# Patient Record
Sex: Female | Born: 1995 | Hispanic: No | Marital: Single | Smoking: Never smoker
Health system: Southern US, Community
[De-identification: ages and names within clinical notes are randomized; demographics above are authoritative.]

## PROBLEM LIST (undated history)

## (undated) DIAGNOSIS — C801 Malignant (primary) neoplasm, unspecified: Secondary | ICD-10-CM

## (undated) DIAGNOSIS — H3500 Unspecified background retinopathy: Secondary | ICD-10-CM

## (undated) DIAGNOSIS — F70 Mild intellectual disabilities: Secondary | ICD-10-CM

## (undated) DIAGNOSIS — E079 Disorder of thyroid, unspecified: Secondary | ICD-10-CM

## (undated) DIAGNOSIS — R569 Unspecified convulsions: Secondary | ICD-10-CM

## (undated) DIAGNOSIS — J45909 Unspecified asthma, uncomplicated: Secondary | ICD-10-CM

## (undated) DIAGNOSIS — Q7033 Webbed toes, bilateral: Secondary | ICD-10-CM

## (undated) HISTORY — PX: RHINOPLASTY FOR CLEFT LIP / PALATE: SUR1285

## (undated) HISTORY — PX: TYMPANOSTOMY TUBE PLACEMENT: SHX32

## (undated) HISTORY — DX: Disorder of thyroid, unspecified: E07.9

## (undated) HISTORY — PX: STRABISMUS SURGERY: SHX218

## (undated) HISTORY — DX: Webbed toes, bilateral: Q70.33

## (undated) HISTORY — PX: EYE SURGERY: SHX253

## (undated) HISTORY — PX: BIOPSY THYROID: PRO38

## (undated) HISTORY — PX: HERNIA REPAIR: SHX51

---
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
## 2017-03-30 ENCOUNTER — Encounter (HOSPITAL_COMMUNITY): Payer: Self-pay

## 2017-04-03 ENCOUNTER — Encounter (HOSPITAL_COMMUNITY)
Admission: RE | Admit: 2017-04-03 | Discharge: 2017-04-03 | Disposition: A | Discharge status: Home / Self Care | Payer: Medicaid Other | Source: Ambulatory Visit | Attending: Ophthalmology | Admitting: Ophthalmology

## 2017-04-03 HISTORY — DX: Unspecified background retinopathy: H35.00

## 2017-04-23 ENCOUNTER — Encounter (HOSPITAL_COMMUNITY): Payer: Self-pay

## 2017-04-23 ENCOUNTER — Encounter (HOSPITAL_COMMUNITY)
Admission: RE | Admit: 2017-04-23 | Discharge: 2017-04-23 | Disposition: A | Discharge status: Home / Self Care | Payer: Medicaid Other | Source: Ambulatory Visit | Attending: Ophthalmology | Admitting: Ophthalmology

## 2017-04-23 DIAGNOSIS — Z01812 Encounter for preprocedural laboratory examination: Secondary | ICD-10-CM | POA: Diagnosis present

## 2017-04-23 DIAGNOSIS — H25043 Posterior subcapsular polar age-related cataract, bilateral: Secondary | ICD-10-CM | POA: Diagnosis not present

## 2017-04-23 HISTORY — DX: Unspecified asthma, uncomplicated: J45.909

## 2017-04-23 HISTORY — DX: Unspecified convulsions: R56.9

## 2017-04-23 HISTORY — DX: Mild intellectual disabilities: F70

## 2017-04-23 LAB — BASIC METABOLIC PANEL
Anion gap: 8 (ref 5–15)
BUN: 18 mg/dL (ref 6–20)
CO2: 26 mmol/L (ref 22–32)
CREATININE: 0.79 mg/dL (ref 0.44–1.00)
Calcium: 9.3 mg/dL (ref 8.9–10.3)
Chloride: 107 mmol/L (ref 101–111)
GFR calc Af Amer: >60 mL/min (ref >60)
Glucose, Bld: 94 mg/dL (ref 65–99)
Potassium: 4.1 mmol/L (ref 3.5–5.1)
SODIUM: 141 mmol/L (ref 135–145)

## 2017-04-23 LAB — CBC WITH DIFFERENTIAL/PLATELET
BASOS ABS: 0.1 10*3/uL (ref 0.0–0.1)
Basophils Relative: 1 %
EOS ABS: 0.5 10*3/uL (ref 0.0–0.7)
EOS PCT: 6 %
HCT: 43.3 % (ref 36.0–46.0)
Hemoglobin: 14.7 g/dL (ref 12.0–15.0)
LYMPHS ABS: 2.6 10*3/uL (ref 0.7–4.0)
Lymphocytes Relative: 30 %
MCH: 29.6 pg (ref 26.0–34.0)
MCHC: 33.9 g/dL (ref 30.0–36.0)
MCV: 87.3 fL (ref 78.0–100.0)
Monocytes Absolute: 0.6 10*3/uL (ref 0.1–1.0)
Monocytes Relative: 7 %
Neutro Abs: 4.9 10*3/uL (ref 1.7–7.7)
Neutrophils Relative %: 56 %
PLATELETS: 293 10*3/uL (ref 150–400)
RBC: 4.96 MIL/uL (ref 3.87–5.11)
RDW: 11.9 % (ref 11.5–15.5)
WBC: 8.7 10*3/uL (ref 4.0–10.5)

## 2017-04-23 LAB — HCG, SERUM, QUALITATIVE: PREG SERUM: NEGATIVE

## 2017-04-23 NOTE — Patient Instructions (Signed)
Your procedure is scheduled on: 04/30/2014   Report to [facility] at  75   AM.  Call this number if you have problems the morning of surgery: [phone]   Do not eat food or drink liquids :After Midnight.      Take these medicines the morning of surgery with A SIP OF WATER: None   Do not wear jewelry, make-up or nail polish.  Do not wear lotions, powders, or perfumes. You may wear deodorant.  Do not shave 48 hours prior to surgery.  Do not bring valuables to the hospital.  Contacts, dentures or bridgework may not be worn into surgery.  Leave suitcase in the car. After surgery it may be brought to your room.  For patients admitted to the hospital, checkout time is 11:00 AM the day of discharge.   Patients discharged the day of surgery will not be allowed to drive home.  :     Please read over the following fact sheets that you were given: Coughing and Deep Breathing, Surgical Site Infection Prevention, Anesthesia Post-op Instructions and Care and Recovery After Surgery    Cataract A cataract is a clouding of the lens of the eye. When a lens becomes cloudy, vision is reduced based on the degree and nature of the clouding. Many cataracts reduce vision to some degree. Some cataracts make people more near-sighted as they develop. Other cataracts increase glare. Cataracts that are ignored and become worse can sometimes look white. The white color can be seen through the pupil. CAUSES   Aging. However, cataracts may occur at any age, even in newborns.   Certain drugs.   Trauma to the eye.   Certain diseases such as diabetes.   Specific eye diseases such as chronic inflammation inside the eye or a sudden attack of a rare form of glaucoma.   Inherited or acquired medical problems.  SYMPTOMS   Gradual, progressive drop in vision in the affected eye.   Severe, rapid visual loss. This most often happens when trauma is the cause.  DIAGNOSIS  To detect a cataract, an eye doctor examines  the lens. Cataracts are best diagnosed with an exam of the eyes with the pupils enlarged (dilated) by drops.  TREATMENT  For an early cataract, vision may improve by using different eyeglasses or stronger lighting. If that does not help your vision, surgery is the only effective treatment. A cataract needs to be surgically removed when vision loss interferes with your everyday activities, such as driving, reading, or watching TV. A cataract may also have to be removed if it prevents examination or treatment of another eye problem. Surgery removes the cloudy lens and usually replaces it with a substitute lens (intraocular lens, IOL).  At a time when both you and your doctor agree, the cataract will be surgically removed. If you have cataracts in both eyes, only one is usually removed at a time. This allows the operated eye to heal and be out of danger from any possible problems after surgery (such as infection or poor wound healing). In rare cases, a cataract may be doing damage to your eye. In these cases, your caregiver may advise surgical removal right away. The vast majority of people who have cataract surgery have better vision afterward. HOME CARE INSTRUCTIONS  If you are not planning surgery, you may be asked to do the following:  Use different eyeglasses.   Use stronger or brighter lighting.   Ask your eye doctor about reducing your medicine  dose or changing medicines if it is thought that a medicine caused your cataract. Changing medicines does not make the cataract go away on its own.   Become familiar with your surroundings. Poor vision can lead to injury. Avoid bumping into things on the affected side. You are at a higher risk for tripping or falling.   Exercise extreme care when driving or operating machinery.   Wear sunglasses if you are sensitive to bright light or experiencing problems with glare.  SEEK IMMEDIATE MEDICAL CARE IF:   You have a worsening or sudden vision loss.    You notice redness, swelling, or increasing pain in the eye.   You have a fever.  Document Released: 10/23/2005 Document Revised: 10/12/2011 Document Reviewed: 06/16/2011 [facility] Patient Information 2012 [place].PATIENT INSTRUCTIONS POST-ANESTHESIA  IMMEDIATELY FOLLOWING SURGERY:  Do not drive or operate machinery for the first twenty four hours after surgery.  Do not make any important decisions for twenty four hours after surgery or while taking narcotic pain medications or sedatives.  If you develop intractable nausea and vomiting or a severe headache please notify your doctor immediately.  FOLLOW-UP:  Please make an appointment with your surgeon as instructed. You do not need to follow up with anesthesia unless specifically instructed to do so.  WOUND CARE INSTRUCTIONS (if applicable):  Keep a dry clean dressing on the anesthesia/puncture wound site if there is drainage.  Once the wound has quit draining you may leave it open to air.  Generally you should leave the bandage intact for twenty four hours unless there is drainage.  If the epidural site drains for more than 36-48 hours please call the anesthesia department.  QUESTIONS?:  Please feel free to call your physician or the hospital operator if you have any questions, and they will be happy to assist you.

## 2017-04-30 ENCOUNTER — Encounter (HOSPITAL_COMMUNITY)
Admission: RE | Disposition: A | Discharge status: Home / Self Care | Payer: Self-pay | Source: Ambulatory Visit | Attending: Ophthalmology

## 2017-04-30 ENCOUNTER — Ambulatory Visit (HOSPITAL_COMMUNITY)
Admission: RE | Admit: 2017-04-30 | Discharge: 2017-04-30 | Disposition: A | Discharge status: Home / Self Care | Payer: Medicaid Other | Source: Ambulatory Visit | Attending: Ophthalmology | Admitting: Ophthalmology

## 2017-04-30 ENCOUNTER — Ambulatory Visit (HOSPITAL_COMMUNITY): Payer: Medicaid Other | Admitting: Anesthesiology

## 2017-04-30 ENCOUNTER — Encounter (HOSPITAL_COMMUNITY): Payer: Self-pay

## 2017-04-30 DIAGNOSIS — F419 Anxiety disorder, unspecified: Secondary | ICD-10-CM | POA: Insufficient documentation

## 2017-04-30 DIAGNOSIS — H25043 Posterior subcapsular polar age-related cataract, bilateral: Secondary | ICD-10-CM | POA: Insufficient documentation

## 2017-04-30 DIAGNOSIS — J45909 Unspecified asthma, uncomplicated: Secondary | ICD-10-CM | POA: Insufficient documentation

## 2017-04-30 HISTORY — PX: CATARACT EXTRACTION W/PHACO: SHX586

## 2017-04-30 SURGERY — PHACOEMULSIFICATION, CATARACT, WITH IOL INSERTION
Anesthesia: General | Site: Eye | Laterality: Bilateral

## 2017-04-30 MED ORDER — CYCLOPENTOLATE-PHENYLEPHRINE 0.2-1 % OP SOLN
1.0000 [drp] | OPHTHALMIC | Status: AC
  Administered 2017-04-30 (×3): 1 [drp] via OPHTHALMIC

## 2017-04-30 MED ORDER — MIDAZOLAM HCL 5 MG/5ML IJ SOLN
INTRAMUSCULAR | Status: DC | PRN
  Administered 2017-04-30 (×2): 1 mg via INTRAVENOUS

## 2017-04-30 MED ORDER — MIDAZOLAM HCL 2 MG/2ML IJ SOLN
INTRAMUSCULAR | Status: AC
  Filled 2017-04-30: qty 2

## 2017-04-30 MED ORDER — PHENYLEPHRINE HCL 2.5 % OP SOLN
1.0000 [drp] | OPHTHALMIC | Status: AC
  Administered 2017-04-30 (×3): 1 [drp] via OPHTHALMIC

## 2017-04-30 MED ORDER — PROPOFOL 10 MG/ML IV BOLUS
INTRAVENOUS | Status: DC | PRN
  Administered 2017-04-30: 120 mg via INTRAVENOUS

## 2017-04-30 MED ORDER — BSS IO SOLN
INTRAOCULAR | Status: DC | PRN
  Administered 2017-04-30 (×2): 15 mL

## 2017-04-30 MED ORDER — TETRACAINE HCL 0.5 % OP SOLN
1.0000 [drp] | OPHTHALMIC | Status: AC
  Administered 2017-04-30 (×3): 1 [drp] via OPHTHALMIC

## 2017-04-30 MED ORDER — HYDROCODONE-ACETAMINOPHEN 7.5-325 MG PO TABS: 1.0000 | ORAL_TABLET | Freq: Once | ORAL | Status: DC | PRN

## 2017-04-30 MED ORDER — PROPOFOL 10 MG/ML IV BOLUS
INTRAVENOUS | Status: AC
  Filled 2017-04-30: qty 20

## 2017-04-30 MED ORDER — PROVISC 10 MG/ML IO SOLN
INTRAOCULAR | Status: DC | PRN
  Administered 2017-04-30 (×2): 0.85 mL via INTRAOCULAR

## 2017-04-30 MED ORDER — LIDOCAINE HCL 3.5 % OP GEL
1.0000 "application " | Freq: Once | OPHTHALMIC | Status: AC
  Administered 2017-04-30: 1 via OPHTHALMIC

## 2017-04-30 MED ORDER — ATROPINE SULFATE 0.4 MG/ML IJ SOLN
INTRAMUSCULAR | Status: AC
  Filled 2017-04-30: qty 1

## 2017-04-30 MED ORDER — NEOMYCIN-POLYMYXIN-DEXAMETH 3.5-10000-0.1 OP SUSP
OPHTHALMIC | Status: DC | PRN
  Administered 2017-04-30 (×2): 2 [drp] via OPHTHALMIC

## 2017-04-30 MED ORDER — FENTANYL CITRATE (PF) 100 MCG/2ML IJ SOLN: 25.0000 ug | INTRAMUSCULAR | Status: DC | PRN

## 2017-04-30 MED ORDER — EPINEPHRINE PF 1 MG/ML IJ SOLN
INTRAOCULAR | Status: DC | PRN
  Administered 2017-04-30 (×2): 500 mL

## 2017-04-30 MED ORDER — MIDAZOLAM HCL 2 MG/2ML IJ SOLN
1.0000 mg | INTRAMUSCULAR | Status: DC | PRN
  Administered 2017-04-30: 1 mg via INTRAVENOUS

## 2017-04-30 MED ORDER — LIDOCAINE HCL (PF) 1 % IJ SOLN
INTRAMUSCULAR | Status: DC | PRN
  Administered 2017-04-30: .4 mL
  Administered 2017-04-30: .5 mL

## 2017-04-30 MED ORDER — FENTANYL CITRATE (PF) 100 MCG/2ML IJ SOLN
INTRAMUSCULAR | Status: AC
  Filled 2017-04-30: qty 2

## 2017-04-30 MED ORDER — MIDAZOLAM HCL 2 MG/2ML IJ SOLN
INTRAMUSCULAR | Status: AC
Start: 2017-04-30 — End: ?
  Filled 2017-04-30: qty 2

## 2017-04-30 MED ORDER — LACTATED RINGERS IV SOLN
INTRAVENOUS | Status: DC
  Administered 2017-04-30: 07:00:00 via INTRAVENOUS

## 2017-04-30 MED ORDER — POVIDONE-IODINE 5 % OP SOLN
OPHTHALMIC | Status: DC | PRN
  Administered 2017-04-30 (×2): 1 via OPHTHALMIC

## 2017-04-30 SURGICAL SUPPLY — 11 items
CLOTH BEACON ORANGE TIMEOUT ST (SAFETY) ×6 IMPLANT
EYE SHIELD UNIVERSAL CLEAR (GAUZE/BANDAGES/DRESSINGS) ×6 IMPLANT
GLOVE BIOGEL PI IND STRL 7.0 (GLOVE) ×4 IMPLANT
GLOVE BIOGEL PI INDICATOR 7.0 (GLOVE) ×8
LENS ALC ACRYL/TECN (Ophthalmic Related) ×6 IMPLANT
PAD ARMBOARD 7.5X6 YLW CONV (MISCELLANEOUS) ×3 IMPLANT
SYRINGE LUER LOK 1CC (MISCELLANEOUS) ×6 IMPLANT
TAPE SURG TRANSPORE 1 IN (GAUZE/BANDAGES/DRESSINGS) ×2 IMPLANT
TAPE SURGICAL TRANSPORE 1 IN (GAUZE/BANDAGES/DRESSINGS) ×4
VISCOELASTIC ADDITIONAL (OPHTHALMIC RELATED) ×3 IMPLANT
WATER STERILE IRR 250ML POUR (IV SOLUTION) ×6 IMPLANT

## 2017-04-30 NOTE — H&P (Signed)
I have reviewed the H&P, the patient was re-examined, and I have identified no interval changes in medical condition and plan of care since the history and physical of record  

## 2017-04-30 NOTE — Transfer of Care (Signed)
Immediate Anesthesia Transfer of Care Note  Patient: DENNI FRANCE  Procedure(s) Performed: Procedure(s) with comments: CATARACT EXTRACTION PHACO AND INTRAOCULAR LENS PLACEMENT (IOC) (Bilateral) - LEFT EYE CDE: 0.46 RIGHT EYE CDE: 0.00  Patient Location: PACU  Anesthesia Type:General  Level of Consciousness: drowsy and patient cooperative  Airway & Oxygen Therapy: Patient Spontanous Breathing  Post-op Assessment: Report given to RN, Post -op Vital signs reviewed and stable and Patient moving all extremities  Post vital signs: Reviewed and stable  Last Vitals:  Vitals:   04/30/17 0643  BP: 127/60  Pulse: 93  Resp: 19  Temp: 36.6 C    Last Pain:  Vitals:   04/30/17 0643  TempSrc: Oral         Complications: No apparent anesthesia complications

## 2017-04-30 NOTE — Anesthesia Postprocedure Evaluation (Signed)
Anesthesia Post Note  Patient: Sherri Wiggins  Procedure(s) Performed: Procedure(s) (LRB): CATARACT EXTRACTION PHACO AND INTRAOCULAR LENS PLACEMENT (IOC) (Bilateral)  Patient location during evaluation: PACU Anesthesia Type: General Level of consciousness: awake and patient cooperative Pain management: pain level controlled Vital Signs Assessment: post-procedure vital signs reviewed and stable Respiratory status: nonlabored ventilation, spontaneous breathing and respiratory function stable Cardiovascular status: blood pressure returned to baseline Postop Assessment: no signs of nausea or vomiting Anesthetic complications: no     Last Vitals:  Vitals:   04/30/17 0643 04/30/17 0835  BP: 127/60 (!) 113/55  Pulse: 93 71  Resp: 19 20  Temp: 36.6 C 36.5 C    Last Pain:  Vitals:   04/30/17 0643  TempSrc: Oral                 Sherri Wiggins

## 2017-04-30 NOTE — Anesthesia Preprocedure Evaluation (Signed)
Anesthesia Evaluation  Patient identified by MRN, date of birth, ID band Patient awake    Reviewed: Allergy & Precautions, NPO status , Patient's Chart, lab work & pertinent test results, reviewed documented beta blocker date and time   History of Anesthesia Complications Negative for: history of anesthetic complications  Airway Mallampati: III  TM Distance: >3 FB Neck ROM: Full    Dental no notable dental hx.    Pulmonary asthma ,    Pulmonary exam normal        Cardiovascular negative cardio ROS Normal cardiovascular exam Rhythm:Regular Rate:Normal     Neuro/Psych Seizures -, Well Controlled,  Anxiety Mental handicap, only one reported, no seizure meds    GI/Hepatic negative GI ROS, Neg liver ROS,   Endo/Other  negative endocrine ROS  Renal/GU negative Renal ROS  negative genitourinary   Musculoskeletal negative musculoskeletal ROS (+)   Abdominal Normal abdominal exam  (+)   Peds Cleft lip/plate repair   Hematology negative hematology ROS (+)   Anesthesia Other Findings   Reproductive/Obstetrics negative OB ROS Pregnancy test negative 23 April 2017                             Anesthesia Physical Anesthesia Plan  ASA: III  Anesthesia Plan: General   Post-op Pain Management:    Induction: Intravenous  PONV Risk Score and Plan:   Airway Management Planned: LMA  Additional Equipment:   Intra-op Plan:   Post-operative Plan: Extubation in OR  Informed Consent: I have reviewed the patients History and Physical, chart, labs and discussed the procedure including the risks, benefits and alternatives for the proposed anesthesia with the patient or authorized representative who has indicated his/her understanding and acceptance.     Plan Discussed with: CRNA and Anesthesiologist  Anesthesia Plan Comments:         Anesthesia Quick Evaluation

## 2017-04-30 NOTE — Anesthesia Procedure Notes (Signed)
Procedure Name: LMA Insertion Date/Time: 04/30/2017 7:32 AM Performed by: Charmaine Downs Pre-anesthesia Checklist: Patient identified, Patient being monitored, Emergency Drugs available, Timeout performed and Suction available Patient Re-evaluated:Patient Re-evaluated prior to inductionOxygen Delivery Method: Circle System Utilized Preoxygenation: Pre-oxygenation with 100% oxygen Intubation Type: IV induction Ventilation: Mask ventilation without difficulty LMA: LMA inserted LMA Size: 3.0 Number of attempts: 1 Placement Confirmation: positive ETCO2 and breath sounds checked- equal and bilateral Tube secured with: Tape Dental Injury: Teeth and Oropharynx as per pre-operative assessment

## 2017-04-30 NOTE — Discharge Instructions (Signed)
Moderate Conscious Sedation, Adult, Care After  These instructions provide you with information about caring for yourself after your procedure. Your health care provider may also give you more specific instructions. Your treatment has been planned according to current medical practices, but problems sometimes occur. Call your health care provider if you have any problems or questions after your procedure.  What can I expect after the procedure?  After your procedure, it is common:   To feel sleepy for several hours.   To feel clumsy and have poor balance for several hours.   To have poor judgment for several hours.   To vomit if you eat too soon.    Follow these instructions at home:  For at least 24 hours after the procedure:     Do not:  ? Participate in activities where you could fall or become injured.  ? Drive.  ? Use heavy machinery.  ? Drink alcohol.  ? Take sleeping pills or medicines that cause drowsiness.  ? Make important decisions or sign legal documents.  ? Take care of children on your own.   Rest.  Eating and drinking   Follow the diet recommended by your health care provider.   If you vomit:  ? Drink water, juice, or soup when you can drink without vomiting.  ? Make sure you have little or no nausea before eating solid foods.  General instructions   Have a responsible adult stay with you until you are awake and alert.   Take over-the-counter and prescription medicines only as told by your health care provider.   If you smoke, do not smoke without supervision.   Keep all follow-up visits as told by your health care provider. This is important.  Contact a health care provider if:   You keep feeling nauseous or you keep vomiting.   You feel light-headed.   You develop a rash.   You have a fever.  Get help right away if:   You have trouble breathing.  This information is not intended to replace advice given to you by your health care provider. Make sure you discuss any questions you have  with your health care provider.  Document Released: 08/13/2013 Document Revised: 03/27/2016 Document Reviewed: 02/12/2016  Elsevier Interactive Patient Education  2018 [employer]

## 2017-04-30 NOTE — Op Note (Signed)
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  Assistants: None  Anesthesia: General with LMA  Indications: Painless, progressive loss of vision with compromise of daily activities. Expectation that cataracts will progress.   Surgery: Cataract Extraction with Intraocular lens Implant Bilateral Eye  Discription: The patient had dilating drops and viscous lidocaine placed into the both eyes in the pre-op holding area. After transfer to the operating room, a time out was performed.  The patient was placed under general anesthesia with an LMA placed without difficulty.   The patient was then prepped and draped on the left side. The right eye was taped shut. Beginning with a 71mm blade a paracentesis port was made at the surgeon's 2 o'clock position. The anterior chamber was then filled with 1% non-preserved lidocaine. This was followed by filling the anterior chamber with Provisc.  A 2.21mm keratome blade was used to make a clear corneal incision at the temporal limbus.  A bent cystatome needle was used to create a continuous tear capsulotomy. Hydrodissection was performed with balanced salt solution on a Fine canula. The lens nucleus was then removed using the phacoemulsification handpiece. Residual cortex was removed with the I&A handpiece. The anterior chamber and capsular bag were refilled with Provisc. A posterior chamber intraocular lens was placed into the capsular bag with it's injector. The implant was positioned with the Kuglan hook. The Provisc was then removed from the anterior chamber and capsular bag with the I&A handpiece. Stromal hydration of the main incision and paracentesis port was performed with BSS on a Fine canula. The wounds were tested for leak which was negative. The patient tolerated the procedure well. There were no  operative complications.  The patient was then prepped and draped on the right side. The left eye was taped shut. Beginning with a 61mm blade a paracentesis port was made at the surgeon's 2 o'clock position. The anterior chamber was then filled with 1% non-preserved lidocaine. This was followed by filling the anterior chamber with Provisc.  A 2.34mm keratome blade was used to make a clear corneal incision at the temporal limbus.  A bent cystatome needle was used to create a continuous tear capsulotomy. Hydrodissection was performed with balanced salt solution on a Fine canula. The lens nucleus was then removed using the phacoemulsification handpiece. Residual cortex was removed with the I&A handpiece. The anterior chamber and capsular bag were refilled with Provisc. A posterior chamber intraocular lens was placed into the capsular bag with it's injector. The implant was positioned with the Kuglan hook. The Provisc was then removed from the anterior chamber and capsular bag with the I&A handpiece. Stromal hydration of the main incision and paracentesis port was performed with BSS on a Fine canula. The wounds were tested for leak which was negative. The patient tolerated the procedure well. There were no operative complications.   The patient was then transferred to the recovery room in stable condition.  Complications: None  Specimen: None  EBL: None  Prosthetic device: OD:  [employer], PCB00, power 11.0, SN [id number] OS:  [employer], PCB00, power 11.5, SN [id number].

## 2017-05-01 ENCOUNTER — Encounter (HOSPITAL_COMMUNITY): Payer: Self-pay | Admitting: Ophthalmology

## 2017-05-01 NOTE — Addendum Note (Signed)
Addendum  created 05/01/17 0816 by Schultz, John R, MD   Anesthesia Attestations filed    

## 2017-11-16 ENCOUNTER — Other Ambulatory Visit (HOSPITAL_COMMUNITY): Payer: Self-pay | Admitting: Internal Medicine

## 2017-11-16 DIAGNOSIS — R221 Localized swelling, mass and lump, neck: Secondary | ICD-10-CM

## 2017-11-20 ENCOUNTER — Ambulatory Visit (HOSPITAL_COMMUNITY)
Admission: RE | Admit: 2017-11-20 | Discharge: 2017-11-20 | Disposition: A | Discharge status: Home / Self Care | Payer: Medicaid Other | Source: Ambulatory Visit | Attending: Internal Medicine | Admitting: Internal Medicine

## 2017-11-22 ENCOUNTER — Ambulatory Visit (HOSPITAL_COMMUNITY)
Admission: RE | Admit: 2017-11-22 | Discharge: 2017-11-22 | Disposition: A | Discharge status: Home / Self Care | Payer: Medicaid Other | Source: Ambulatory Visit | Attending: Internal Medicine | Admitting: Internal Medicine

## 2017-11-26 ENCOUNTER — Other Ambulatory Visit: Payer: Self-pay | Admitting: Obstetrics and Gynecology

## 2017-11-26 DIAGNOSIS — O3680X0 Pregnancy with inconclusive fetal viability, not applicable or unspecified: Secondary | ICD-10-CM

## 2017-11-27 ENCOUNTER — Other Ambulatory Visit: Payer: Medicaid Other

## 2017-11-27 ENCOUNTER — Ambulatory Visit (INDEPENDENT_AMBULATORY_CARE_PROVIDER_SITE_OTHER): Payer: Medicaid Other

## 2017-11-27 ENCOUNTER — Other Ambulatory Visit: Payer: Self-pay | Admitting: Obstetrics and Gynecology

## 2017-11-27 DIAGNOSIS — O039 Complete or unspecified spontaneous abortion without complication: Secondary | ICD-10-CM

## 2017-11-27 DIAGNOSIS — O208 Other hemorrhage in early pregnancy: Secondary | ICD-10-CM | POA: Diagnosis not present

## 2017-11-27 DIAGNOSIS — O3680X0 Pregnancy with inconclusive fetal viability, not applicable or unspecified: Secondary | ICD-10-CM

## 2017-11-27 DIAGNOSIS — O209 Hemorrhage in early pregnancy, unspecified: Secondary | ICD-10-CM

## 2017-11-27 DIAGNOSIS — Z3A08 8 weeks gestation of pregnancy: Secondary | ICD-10-CM

## 2017-11-27 NOTE — Progress Notes (Signed)
[place] TA/TV: no IUP visualized,EEC 11.6 mm (small amount of color flow),normal ovaries bilat,no free fluid,no pain during ultrasound,will have labs done to day per Jennifer,blood type B+ per patient

## 2017-11-29 ENCOUNTER — Ambulatory Visit (HOSPITAL_COMMUNITY)
Admission: RE | Admit: 2017-11-29 | Discharge: 2017-11-29 | Disposition: A | Discharge status: Home / Self Care | Payer: Medicaid Other | Source: Ambulatory Visit | Attending: Internal Medicine | Admitting: Internal Medicine

## 2017-11-29 DIAGNOSIS — E041 Nontoxic single thyroid nodule: Secondary | ICD-10-CM | POA: Diagnosis not present

## 2017-11-29 DIAGNOSIS — R221 Localized swelling, mass and lump, neck: Secondary | ICD-10-CM | POA: Diagnosis present

## 2017-12-05 ENCOUNTER — Encounter: Payer: Self-pay | Admitting: Adult Health

## 2017-12-05 ENCOUNTER — Ambulatory Visit: Payer: Medicaid Other | Admitting: Adult Health

## 2017-12-05 VITALS — BP 100/60 | HR 99 | Ht 62.0 in | Wt 115.0 lb

## 2017-12-05 DIAGNOSIS — E041 Nontoxic single thyroid nodule: Secondary | ICD-10-CM

## 2017-12-05 DIAGNOSIS — Z3201 Encounter for pregnancy test, result positive: Secondary | ICD-10-CM | POA: Diagnosis not present

## 2017-12-05 DIAGNOSIS — O039 Complete or unspecified spontaneous abortion without complication: Secondary | ICD-10-CM | POA: Diagnosis not present

## 2017-12-05 LAB — POCT URINE PREGNANCY: Preg Test, Ur: POSITIVE — AB

## 2017-12-05 NOTE — Progress Notes (Addendum)
Subjective:     Patient ID: Sherri Wiggins, female   DOB: [birth year], 22 y.o.   MRN: [id number]  HPI Sherri Wiggins is a 22 year old biracial female in for F/U on miscarriage.Was seen at [facility] 11/24/17 for bleeding and had QHCG of about 12,000 and blood type B+.Had [place] here 11/27/17, no IUP and Sherri Wiggins said could not get blood drawn then. PCP is [facility].   Review of Systems No bleeding now,stopped about 3 days ago.  Reviewed past medical,surgical, social and family history. Reviewed medications and allergies.     Objective:   Physical Exam BP 100/60 (BP Location: Left Arm, Patient Position: Sitting, Cuff Size: Small)   Pulse 99   Ht 5\' 2"  (1.575 m)   Wt 115 lb (52.2 kg)   BMI 21.03 kg/m UPT +,Skin warm and dry. Neck: mid line trachea, has large right thyroid nodule(has had [place], needs FNA), good ROM, no lymphadenopathy noted. Lungs: clear to ausculation bilaterally. Cardiovascular: regular rate and rhythm.PHQ 9 score 3.    Needs F/U [facility], and will request records.Will see next week and discuss birth control.   Assessment:     1. Miscarriage   2. Pregnancy test positive   3. Thyroid nodule       Plan:     Check QHCG in am, go home and hydrate Request records from [facility] from 11/24/17 F/U in 1 week  F/U with Dr Nevada Crane about thyroid

## 2017-12-07 ENCOUNTER — Other Ambulatory Visit: Payer: Self-pay | Admitting: Internal Medicine

## 2017-12-07 ENCOUNTER — Telehealth: Payer: Self-pay | Admitting: Adult Health

## 2017-12-07 DIAGNOSIS — E041 Nontoxic single thyroid nodule: Secondary | ICD-10-CM

## 2017-12-07 LAB — BETA HCG QUANT (REF LAB): HCG QUANT: 182 m[IU]/mL

## 2017-12-07 NOTE — Telephone Encounter (Signed)
Left message to call me.

## 2017-12-13 ENCOUNTER — Encounter: Payer: Self-pay | Admitting: Adult Health

## 2017-12-13 ENCOUNTER — Ambulatory Visit: Payer: Medicaid Other | Admitting: Adult Health

## 2017-12-13 VITALS — BP 102/60 | HR 76 | Ht 62.0 in | Wt 115.5 lb

## 2017-12-13 DIAGNOSIS — O039 Complete or unspecified spontaneous abortion without complication: Secondary | ICD-10-CM | POA: Diagnosis not present

## 2017-12-13 NOTE — Progress Notes (Signed)
Subjective:     Patient ID: Sherri Wiggins, female   DOB: [birth year], 22 y.o.   MRN: [id number]  HPI Sherri Wiggins is a 22 year old biracial female, in for F/U on miscarriage, no bleeding.Wants to use OCs for birth control.   Review of Systems  No bleeding Reviewed past medical,surgical, social and family history. Reviewed medications and allergies.     Objective:   Physical Exam BP 102/60 (BP Location: Left Arm, Patient Position: Sitting, Cuff Size: Normal)   Pulse 76   Ht 5\' 2"  (1.575 m)   Wt 115 lb 8 oz (52.4 kg)   BMI 21.13 kg/m    Talk only: QHCG was dropped from 12,643.0 to 182 on 12/06/17,will check QHCG in am and no sex. Will start in OCs when [facility] <5.  Assessment:     1. Miscarriage       Plan:    Check QHCG in am F/U in 2 weeks No sex

## 2017-12-18 ENCOUNTER — Telehealth: Payer: Self-pay | Admitting: Adult Health

## 2017-12-18 LAB — BETA HCG QUANT (REF LAB): hCG Quant: 24 m[IU]/mL

## 2017-12-18 NOTE — Telephone Encounter (Signed)
Pt aware that QHCG down to 24 recheck in 1 week when has appt.

## 2017-12-18 NOTE — Telephone Encounter (Signed)
Left message to call me.

## 2017-12-27 ENCOUNTER — Ambulatory Visit: Payer: Medicaid Other | Admitting: Adult Health

## 2018-01-03 ENCOUNTER — Encounter: Payer: Self-pay | Admitting: Adult Health

## 2018-01-03 ENCOUNTER — Ambulatory Visit: Payer: Medicaid Other

## 2018-01-03 ENCOUNTER — Encounter (INDEPENDENT_AMBULATORY_CARE_PROVIDER_SITE_OTHER): Payer: Self-pay

## 2018-01-03 ENCOUNTER — Ambulatory Visit: Payer: Medicaid Other | Admitting: Adult Health

## 2018-01-03 VITALS — BP 102/68 | HR 74 | Ht 62.0 in | Wt 115.2 lb

## 2018-01-03 DIAGNOSIS — Z8759 Personal history of other complications of pregnancy, childbirth and the puerperium: Secondary | ICD-10-CM | POA: Diagnosis not present

## 2018-01-03 DIAGNOSIS — E041 Nontoxic single thyroid nodule: Secondary | ICD-10-CM | POA: Diagnosis not present

## 2018-01-03 DIAGNOSIS — Z30011 Encounter for initial prescription of contraceptive pills: Secondary | ICD-10-CM | POA: Insufficient documentation

## 2018-01-03 DIAGNOSIS — Z3202 Encounter for pregnancy test, result negative: Secondary | ICD-10-CM

## 2018-01-03 LAB — POCT URINE PREGNANCY: PREG TEST UR: NEGATIVE

## 2018-01-03 MED ORDER — NORETHIN ACE-ETH ESTRAD-FE 1-20 MG-MCG(24) PO CAPS
1.0000 | ORAL_CAPSULE | Freq: Every day | ORAL | 12 refills | Status: DC
Start: 2018-01-03 — End: 2018-04-02

## 2018-01-03 NOTE — Progress Notes (Signed)
Subjective:     Patient ID: Sherri Wiggins, female   DOB: May 08, 1996, 22 y.o.   MRN: [id number]  HPI Sherri Wiggins is a 22 year old biracial female in for UPT and start OCs, sp recent miscarriage.   Review of Systems Patient denies any headaches, hearing loss, fatigue, blurred vision, shortness of breath, chest pain, abdominal pain, problems with bowel movements, urination, or intercourse. No joint pain or mood swings. Reviewed past medical,surgical, social and family history. Reviewed medications and allergies.     Objective:   Physical Exam BP 102/68 (BP Location: Right Arm, Patient Position: Sitting, Cuff Size: Normal)   Pulse 74   Ht 5\' 2"  (1.575 m)   Wt 115 lb 3.2 oz (52.3 kg)   BMI 21.07 kg/m UPT negative.Skin warm and dry. Neck: mid line trachea,  Right thyroid nodule , good ROM, no lymphadenopathy noted. Lungs: clear to ausculation bilaterally. Cardiovascular: regular rate and rhythm. Will start [place] today and use condoms.But discussed nexplanon too, with pt and her guardian. She said she had period sometime last week.     Assessment:     1. Encounter for initial prescription of contraceptive pills   2. Pregnancy examination or test, negative result   3. History of miscarriage   4. Thyroid nodule       Plan:    Given 1 pack to start [place] today Meds ordered this encounter  Medications  . Norethin Ace-Eth Estrad-FE (TAYTULLA) 1-20 MG-MCG(24) CAPS    Sig: Take 1 tablet by mouth daily.    Dispense:  28 capsule    Refill:  12    Order Specific Question:   Supervising Provider    Answer:   Florian Buff [[id number]]  Use condoms Review handout on nexplanon Follow up in 3 months for pap and physical and ROS

## 2018-02-06 ENCOUNTER — Other Ambulatory Visit (HOSPITAL_COMMUNITY)
Admission: RE | Admit: 2018-02-06 | Discharge: 2018-02-06 | Disposition: A | Discharge status: Home / Self Care | Payer: Medicaid Other | Source: Ambulatory Visit | Attending: Radiology | Admitting: Radiology

## 2018-02-06 ENCOUNTER — Ambulatory Visit
Admission: RE | Admit: 2018-02-06 | Discharge: 2018-02-06 | Disposition: A | Discharge status: Home / Self Care | Payer: Medicaid Other | Source: Ambulatory Visit | Attending: Internal Medicine | Admitting: Internal Medicine

## 2018-02-06 DIAGNOSIS — E041 Nontoxic single thyroid nodule: Secondary | ICD-10-CM | POA: Diagnosis present

## 2018-03-11 ENCOUNTER — Ambulatory Visit: Payer: Medicaid Other | Admitting: "Endocrinology

## 2018-03-11 ENCOUNTER — Encounter: Payer: Self-pay | Admitting: "Endocrinology

## 2018-03-11 VITALS — BP 117/77 | HR 63 | Ht 64.0 in | Wt 114.0 lb

## 2018-03-11 DIAGNOSIS — E049 Nontoxic goiter, unspecified: Secondary | ICD-10-CM | POA: Insufficient documentation

## 2018-03-11 NOTE — Progress Notes (Signed)
Endocrinology Consult Note                                            03/11/2018, 9:48 AM   Subjective:    Patient ID: Sherri Wiggins, female    DOB: 01-31-1996, PCP Celene Squibb, MD   Past Medical History:  Diagnosis Date  . Asthma    as child  . 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 Retinopathy   . Seizures ([place])    One after oral surgery 10 years ago. On no meds, no more seizures   Past Surgical History:  Procedure Laterality Date  . CATARACT EXTRACTION W/PHACO Bilateral 04/30/2017   Procedure: CATARACT EXTRACTION PHACO AND INTRAOCULAR LENS PLACEMENT (IOC);  Surgeon: Tonny Branch, MD;  Location: [facility];  Service: Ophthalmology;  Laterality: Bilateral;  LEFT EYE CDE: 0.46 RIGHT EYE CDE: 0.00  . EYE SURGERY     laser  . HERNIA REPAIR Right   . RHINOPLASTY FOR CLEFT LIP / PALATE    . STRABISMUS SURGERY    . TYMPANOSTOMY TUBE PLACEMENT     Social History   Socioeconomic History  . Marital status: Single    Spouse name: Not on file  . Number of children: Not on file  . Years of education: Not on file  . Highest education level: Not on file  Occupational History  . Not on file  Social Needs  . Financial resource strain: Not on file  . Food insecurity:    Worry: Not on file    Inability: Not on file  . Transportation needs:    Medical: Not on file    Non-medical: Not on file  Tobacco Use  . Smoking status: Never Smoker  . Smokeless tobacco: Never Used  Substance and Sexual Activity  . Alcohol use: Yes    Comment: hardly ever  . Drug use: No  . Sexual activity: Yes    Birth control/protection: None, Condom  Lifestyle  . Physical activity:    Days per week: Not on file    Minutes per session: Not on file  . Stress: Not on file  Relationships  . Social connections:    Talks on phone: Not on file    Gets together: Not on file    Attends religious service: Not on file    Active member of club or organization: Not on file     Attends meetings of clubs or organizations: Not on file    Relationship status: Not on file  Other Topics Concern  . Not on file  Social History Narrative  . Not on file   Outpatient Encounter Medications as of 03/11/2018  Medication Sig  . Norethin Ace-Eth Estrad-FE (TAYTULLA) 1-20 MG-MCG(24) CAPS Take 1 tablet by mouth daily.   No facility-administered encounter medications on file as of 03/11/2018.    ALLERGIES: No Known Allergies  VACCINATION STATUS:  There is no immunization history on file for this patient.  HPI Sherri Wiggins is 22 y.o. female who presents today with a medical history as above. she is being seen in consultation for nodular goiter status post fine-needle aspiration requested by Celene Squibb, MD. Patient is a poor historian, accompanied by her grandmother who elaborates most of her history. -She denies any prior history of thyroid dysfunction.  She was  found to have 3.9 cm solitary nodule on the right lobe of her thyroid on November 29, 2017 for which she subsequently underwent fine-needle aspiration on February 06, 2018 which revealed atypia of undetermined significance or follicular lesion of undetermined significance (Bethesda category III).  -She denies dysphagia, odynophagia, voice change.  He is status post corrective surgery for cleft lip and palate in 1999.  -She denies recent weight change, heat/cold intolerance. -Her grandmother reveals that 1 of the patient great and was diagnosed with thyroid cancer. -Reports normal appetite, denies tremors nor palpitations.  Review of Systems  Constitutional: no weight gain/loss, no fatigue, no subjective hyperthermia, no subjective hypothermia Eyes: no blurry vision, no xerophthalmia ENT: no sore throat, no nodules palpated in throat, no dysphagia/odynophagia, no hoarseness Cardiovascular: no Chest Pain, no Shortness of Breath, no palpitations, no leg swelling Respiratory: no cough, no SOB Gastrointestinal: no  Nausea/Vomiting/Diarhhea Musculoskeletal: no muscle/joint aches Skin: no rashes Neurological: no tremors, no numbness, no tingling, no dizziness Psychiatric: no depression, no anxiety  Objective:    BP 117/77   Pulse 63   Ht 5\' 4"  (1.626 m)   Wt 114 lb (51.7 kg)   BMI 19.57 kg/m   Wt Readings from Last 3 Encounters:  03/11/18 114 lb (51.7 kg)  01/03/18 115 lb 3.2 oz (52.3 kg)  12/13/17 115 lb 8 oz (52.4 kg)    Physical Exam  Constitutional: + Light build for her height,  not in acute distress, normal state of mind Eyes: PERRLA, EOMI, no exophthalmos ENT: + Scars from her prior surgeries for cleft lip and palate repair.  Moist mucous membranes, + palpable thyromegaly, no cervical lymphadenopathy Cardiovascular: normal precordial activity, Regular Rate and Rhythm, no Murmur/Rubs/Gallops Respiratory:  adequate breathing efforts, no gross chest deformity, Clear to auscultation bilaterally Gastrointestinal: abdomen soft, Non -tender, No distension, Bowel Sounds present Musculoskeletal: no gross deformities, strength intact in all four extremities Skin: moist, warm, no rashes Neurological: no tremor with outstretched hands, Deep tendon reflexes normal in all four extremities.  CMP ( most recent) CMP     Component Value Date/Time   NA 141 04/23/2017 1435   K 4.1 04/23/2017 1435   CL 107 04/23/2017 1435   CO2 26 04/23/2017 1435   GLUCOSE 94 04/23/2017 1435   BUN 18 04/23/2017 1435   CREATININE 0.79 04/23/2017 1435   CALCIUM 9.3 04/23/2017 1435   GFRNONAA >60 04/23/2017 1435   GFRAA >60 04/23/2017 1435   No recent thyroid function test to review. Ultrasound November 29, 2017: Right lobe measured 4.9 cm, left lobe measured 4.4 submitted.  The nodule on the right lobe measures 3.9 cm x 3.100 x 1.9 cm. Fine-needle aspiration on February 06, 2018 revealed atypia of undetermined significance or follicular lesion of undetermined significance (Bethesda category III)   Assessment &  Plan:   1. Nodular goiter  - AVIENDHA AZBELL  is being seen at a kind request of [place] Crane, Edwinna Areola, MD. - I have reviewed her available thyroid records and clinically evaluated the patient. -Based on her completed thyroid work-up she has abnormal thyroid cytology involving atypia of undetermined significance or follicular lesion of undetermined significance carrying her average malignancy risk of 5-15%.  -I have discussed 2 options with her and her grandmother in the exam room including repeat of fine-needle aspiration in 3 months with molecular testing of aspirate versus direct thyroidectomy. -Given her relative young age at 69 years, if she ends up having total thyroidectomy, lifelong thyroid hormone replacement presents  a challenge given her mental retardation.   - Every attempt to preserve her thyroid function will be made and the first option is reasonable for her. -She will have thyroid function test today and repeat FNA with molecular testing in 7 weeks from now with office visit. -If her next FNA is abnormal, she will be considered for total thyroidectomy.  -I did not initiate any prescription today.  Follow up plan: Return in about 3 months (around 06/11/2018) for labs today.   Glade Lloyd, MD [facility] [address] [place], [place] [address] Phone: [phone]  Fax: [phone]     03/11/2018, 9:48 AM  This note was partially dictated with voice recognition software. Similar sounding words can be transcribed inadequately or may not  be corrected upon review.

## 2018-03-12 LAB — THYROGLOBULIN ANTIBODY

## 2018-03-12 LAB — TSH: TSH: 1.4 mIU/L

## 2018-03-12 LAB — T3, FREE: T3, Free: 2.9 pg/mL (ref 2.3–4.2)

## 2018-03-12 LAB — T4, FREE: Free T4: 1.5 ng/dL (ref 0.8–1.8)

## 2018-03-12 LAB — THYROID PEROXIDASE ANTIBODY: Thyroperoxidase Ab SerPl-aCnc: 2 IU/mL (ref <9)

## 2018-04-02 ENCOUNTER — Other Ambulatory Visit (HOSPITAL_COMMUNITY)
Admission: RE | Admit: 2018-04-02 | Discharge: 2018-04-02 | Disposition: A | Discharge status: Home / Self Care | Payer: Medicaid Other | Source: Ambulatory Visit | Attending: Adult Health | Admitting: Adult Health

## 2018-04-02 ENCOUNTER — Ambulatory Visit (INDEPENDENT_AMBULATORY_CARE_PROVIDER_SITE_OTHER): Payer: Medicaid Other | Admitting: Adult Health

## 2018-04-02 ENCOUNTER — Encounter: Payer: Self-pay | Admitting: Adult Health

## 2018-04-02 VITALS — BP 112/70 | HR 76 | Ht 62.0 in | Wt 113.0 lb

## 2018-04-02 DIAGNOSIS — Z3041 Encounter for surveillance of contraceptive pills: Secondary | ICD-10-CM

## 2018-04-02 DIAGNOSIS — Z01419 Encounter for gynecological examination (general) (routine) without abnormal findings: Secondary | ICD-10-CM | POA: Insufficient documentation

## 2018-04-02 DIAGNOSIS — N870 Mild cervical dysplasia: Secondary | ICD-10-CM | POA: Insufficient documentation

## 2018-04-02 DIAGNOSIS — R51 Headache: Secondary | ICD-10-CM

## 2018-04-02 MED ORDER — NORETHIN ACE-ETH ESTRAD-FE 1-20 MG-MCG(24) PO CAPS: 1.0000 | ORAL_CAPSULE | Freq: Every day | ORAL | 12 refills | Status: DC

## 2018-04-02 NOTE — Progress Notes (Signed)
Patient ID: Sherri Wiggins, female   DOB: Jul 15, 1996, 22 y.o.   MRN: [id number] History of Present Illness: Sherri Wiggins is a 22 year old biracial female, single in for a well woman gyn exam and pap. PCP is Dr [place] Crane.    Current Medications, Allergies, Past Medical History, Past Surgical History, Family History and Social History were reviewed in [employer] record.     Review of Systems: Patient denies any daily headaches, hearing loss, fatigue, blurred vision, shortness of breath, chest pain, abdominal pain, problems with bowel movements, urination, or intercourse. No joint pain or mood swings. Eat before taking OCs, has had some headaches with them.    Physical Exam:BP 112/70 (BP Location: Left Arm, Patient Position: Sitting, Cuff Size: Normal)   Pulse 76   Ht 5\' 2"  (1.575 m)   Wt 113 lb (51.3 kg)   LMP 03/14/2018   BMI 20.67 kg/m  General:  Well developed, well nourished, no acute distress Skin:  Warm and dry Neck:  Midline trachea, thyroid is enlarged,( had biopsy and sees Dr Dorris Fetch), good ROM, no lymphadenopathy Lungs; Clear to auscultation bilaterally Breast:  No dominant palpable mass, retraction, or nipple discharge Cardiovascular: Regular rate and rhythm Abdomen:  Soft, non tender, no hepatosplenomegaly Pelvic:  External genitalia is normal in appearance, no lesions.  The vagina is normal in appearance. Urethra has no lesions or masses. The cervix is poorly seen, pap with GC/CHL performed, with blind sweep, she was tense. Uterus is felt to be normal size, shape, and contour.  No adnexal masses or tenderness noted.Bladder is non tender, no masses felt. Extremities/musculoskeletal:  No swelling or varicosities noted, no clubbing or cyanosis Psych:  No mood changes, alert and cooperative,seems happy PHQ 2 score 1.  Impression: 1. Encounter for gynecological examination with Papanicolaou smear of cervix   2. Encounter for surveillance of contraceptive pills        Plan:  Meds ordered this encounter  Medications  . Norethin Ace-Eth Estrad-FE (TAYTULLA) 1-20 MG-MCG(24) CAPS    Sig: Take 1 tablet by mouth daily.    Dispense:  28 capsule    Refill:  12    Order Specific Question:   Supervising Provider    Answer:   Florian Buff [[id number]]   Physical in 1 year  Pap in 3 if normal

## 2018-04-04 ENCOUNTER — Ambulatory Visit
Admission: RE | Admit: 2018-04-04 | Discharge: 2018-04-04 | Disposition: A | Discharge status: Home / Self Care | Payer: Medicaid Other | Source: Ambulatory Visit | Attending: "Endocrinology | Admitting: "Endocrinology

## 2018-04-04 DIAGNOSIS — E049 Nontoxic goiter, unspecified: Secondary | ICD-10-CM

## 2018-04-05 ENCOUNTER — Encounter: Payer: Self-pay | Admitting: Women's Health

## 2018-04-05 DIAGNOSIS — R87619 Unspecified abnormal cytological findings in specimens from cervix uteri: Secondary | ICD-10-CM | POA: Insufficient documentation

## 2018-04-08 ENCOUNTER — Telehealth: Payer: Self-pay | Admitting: Women's Health

## 2018-04-08 NOTE — Telephone Encounter (Signed)
Notified pt of abnormal pap (LSIL/HPV), switched to front to schedule colpo.  Roma Schanz, CNM, [facility] 04/08/2018 3:42 PM

## 2018-04-09 ENCOUNTER — Telehealth: Payer: Self-pay | Admitting: "Endocrinology

## 2018-04-09 LAB — CYTOLOGY - PAP
Adequacy: ABSENT — AB
Chlamydia: NEGATIVE
Neisseria Gonorrhea: NEGATIVE

## 2018-04-09 NOTE — Telephone Encounter (Signed)
We have not received the results yet.

## 2018-04-09 NOTE — Telephone Encounter (Signed)
Mardene Celeste is calling on behalf of [facility] Thyroid Biopsy results, please advise?

## 2018-04-10 NOTE — Telephone Encounter (Signed)
Pt.notified

## 2018-04-23 NOTE — Telephone Encounter (Signed)
Sherri Wiggins is calling back requesting biopsy results.

## 2018-04-23 NOTE — Telephone Encounter (Signed)
Her biopsy results are the same as last time-inconclusive.  However, we have not received the results of her molecular testing yet.

## 2018-04-25 ENCOUNTER — Ambulatory Visit: Payer: Medicaid Other | Admitting: Obstetrics and Gynecology

## 2018-04-25 ENCOUNTER — Encounter: Payer: Self-pay | Admitting: Obstetrics and Gynecology

## 2018-04-25 DIAGNOSIS — R87612 Low grade squamous intraepithelial lesion on cytologic smear of cervix (LGSIL): Secondary | ICD-10-CM

## 2018-04-25 DIAGNOSIS — Z3202 Encounter for pregnancy test, result negative: Secondary | ICD-10-CM

## 2018-04-25 LAB — POCT URINE PREGNANCY: Preg Test, Ur: NEGATIVE

## 2018-04-25 NOTE — Progress Notes (Signed)
Patient ID: AESHA AGRAWAL, female   DOB: 22-Mar-1996, 22 y.o.   MRN: [id number]    [facility] Visit  @DATE @            Patient name: SKARLETH DELMONICO MRN [id number]  Date of birth: 23-Jun-1996  CC & HPI:  KATALIN COLLEDGE is a 22 y.o. female presenting today for a colposcopy for her LGSIL pap, 04/02/2018. She is accompanied by her grandmother and her sister. She is sexually active with one partner.  SHe received Gardasil immunization in the past.  She denies fever, chills or any other symptoms or complaints at this time.   ROS:  ROS +LGSIL pap -fever -chills All systems are negative except as noted in the HPI and PMH.   Pertinent History Reviewed:   Reviewed: Significant for mild mental retardation Medical         Past Medical History:  Diagnosis Date  . Asthma    as child  . 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 Retinopathy   . Seizures ([place])    One after oral surgery 10 years ago. On no meds, no more seizures  . Thyroid disease    enlarged                              Surgical Hx:    Past Surgical History:  Procedure Laterality Date  . BIOPSY THYROID     x 2  . CATARACT EXTRACTION W/PHACO Bilateral 04/30/2017   Procedure: CATARACT EXTRACTION PHACO AND INTRAOCULAR LENS PLACEMENT (IOC);  Surgeon: Tonny Branch, MD;  Location: [facility];  Service: Ophthalmology;  Laterality: Bilateral;  LEFT EYE CDE: 0.46 RIGHT EYE CDE: 0.00  . EYE SURGERY     laser  . HERNIA REPAIR Right   . RHINOPLASTY FOR CLEFT LIP / PALATE    . STRABISMUS SURGERY    . TYMPANOSTOMY TUBE PLACEMENT     Medications: Reviewed & Updated - see associated section                       Current Outpatient Medications:  .  Norethin Ace-Eth Estrad-FE (TAYTULLA) 1-20 MG-MCG(24) CAPS, Take 1 tablet by mouth daily., Disp: 28 capsule, Rfl: 12   Social History: Reviewed -  reports that she has never smoked. She has never used smokeless tobacco.  Objective Findings:  Vitals:  Blood pressure 126/60, pulse (!) 46, height 5\' 2"  (1.575 m), weight 115 lb 6.4 oz (52.3 kg), last menstrual period 04/20/2018.  PHYSICAL EXAMINATION General appearance - alert, well appearing, and in no distress, oriented to person, place, and time and normal appearing weight Mental status - alert, oriented to person, place, and time, affect appropriate to mood PELVIC DEFERRED  Assessment & Plan:   A:  1. 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 F/U 1 year for repeat pap   By signing my name below, I, Margit Banda, attest that this documentation has been prepared under the direction and in the presence of Jonnie Kind, MD. Electronically Signed: Margit Banda, Medical Scribe. 04/25/18. 11:49 AM.  I personally performed the services described in this documentation, which  was SCRIBED in my presence. The recorded information has been reviewed and considered accurate. It has been edited as necessary during review. Jonnie Kind, MD

## 2018-04-26 ENCOUNTER — Encounter (HOSPITAL_COMMUNITY): Payer: Self-pay | Admitting: Radiology

## 2018-06-12 ENCOUNTER — Ambulatory Visit (INDEPENDENT_AMBULATORY_CARE_PROVIDER_SITE_OTHER): Payer: Medicaid Other | Admitting: "Endocrinology

## 2018-06-12 DIAGNOSIS — R899 Unspecified abnormal finding in specimens from other organs, systems and tissues: Secondary | ICD-10-CM

## 2018-06-12 NOTE — Progress Notes (Signed)
Endocrinology follow-up note                                            06/12/2018, 4:35 PM   Subjective:    Patient ID: Sherri Wiggins, female    DOB: 07-Sep-1996, PCP Celene Squibb, MD   Past Medical History:  Diagnosis Date  . Asthma    as child  . 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 Retinopathy   . Seizures ([place])    One after oral surgery 10 years ago. On no meds, no more seizures  . Thyroid disease    enlarged   Past Surgical History:  Procedure Laterality Date  . BIOPSY THYROID     x 2  . CATARACT EXTRACTION W/PHACO Bilateral 04/30/2017   Procedure: CATARACT EXTRACTION PHACO AND INTRAOCULAR LENS PLACEMENT (IOC);  Surgeon: Tonny Branch, MD;  Location: [facility];  Service: Ophthalmology;  Laterality: Bilateral;  LEFT EYE CDE: 0.46 RIGHT EYE CDE: 0.00  . EYE SURGERY     laser  . HERNIA REPAIR Right   . RHINOPLASTY FOR CLEFT LIP / PALATE    . STRABISMUS SURGERY    . TYMPANOSTOMY TUBE PLACEMENT     Social History   Socioeconomic History  . Marital status: Single    Spouse name: Not on file  . Number of children: Not on file  . Years of education: Not on file  . Highest education level: Not on file  Occupational History  . Not on file  Social Needs  . Financial resource strain: Not on file  . Food insecurity:    Worry: Not on file    Inability: Not on file  . Transportation needs:    Medical: Not on file    Non-medical: Not on file  Tobacco Use  . Smoking status: Never Smoker  . Smokeless tobacco: Never Used  Substance and Sexual Activity  . Alcohol use: Not Currently  . Drug use: No  . Sexual activity: Yes    Birth control/protection: Condom, Pill  Lifestyle  . Physical activity:    Days per week: Not on file    Minutes per session: Not on file  . Stress: Not on file  Relationships  . Social connections:    Talks on phone: Not on file    Gets together: Not on file    Attends religious service: Not on file     Active member of club or organization: Not on file    Attends meetings of clubs or organizations: Not on file    Relationship status: Not on file  Other Topics Concern  . Not on file  Social History Narrative  . Not on file   Outpatient Encounter Medications as of 06/12/2018  Medication Sig  . Norethin Ace-Eth Estrad-FE (TAYTULLA) 1-20 MG-MCG(24) CAPS Take 1 tablet by mouth daily.   No facility-administered encounter medications on file as of 06/12/2018.    ALLERGIES: No Known Allergies  VACCINATION STATUS:  There is no immunization history on file for this patient.  HPI Sherri Wiggins is 22 y.o. female who presents today with a medical history as above. she is being seen in follow-up for nodular goiter status post fine-needle aspiration and Afirma molecular studies of asprate.  She has suboptimal intelligence,  a poor historian, accompanied by her grandmother  who elaborates most of her history. -She denies any prior history of thyroid dysfunction.  She was found to have 3.9 cm solitary nodule on the right lobe of her thyroid on November 29, 2017 for which she subsequently underwent fine-needle aspiration on February 06, 2018 which revealed atypia of undetermined significance or follicular lesion of undetermined significance (Bethesda category III).  -On her subsequent fine-needle aspiration for afirma testing collected on Apr 04, 2018 the report was suspicious (risk of malignancy approximately 50%).  -She denies dysphagia, odynophagia, voice change.  She is status post corrective surgery for cleft lip and palate in 1999.   -She denies recent weight change, heat/cold intolerance. -Her grandmother reveals that 1 of the patient great aunt was diagnosed with thyroid cancer. -Reports normal appetite, steady weight, denies tremors nor palpitations.  Review of Systems  Constitutional: + Steady weight, no fatigue, no subjective hyperthermia, no subjective hypothermia Eyes: no blurry vision, no  xerophthalmia ENT: no sore throat, + right thyroid nodule, no dysphagia/odynophagia, no hoarseness Cardiovascular: no Chest Pain, no Shortness of Breath, no palpitations, no leg swelling Respiratory: no cough, no SOB Gastrointestinal: no Nausea/Vomiting/Diarhhea Musculoskeletal: no muscle/joint aches Skin: no rashes Neurological: no tremors, no numbness, no tingling, no dizziness Psychiatric: no depression, no anxiety  Objective:    BP 110/78   Pulse 70   Ht 5\' 2"  (1.575 m)   Wt 115 lb (52.2 kg)   BMI 21.03 kg/m   Wt Readings from Last 3 Encounters:  06/12/18 115 lb (52.2 kg)  04/25/18 115 lb 6.4 oz (52.3 kg)  04/02/18 113 lb (51.3 kg)    Physical Exam  Constitutional: + Light build for her height,  not in acute distress, normal state of mind Eyes: PERRLA, EOMI, no exophthalmos ENT: + Scars from her prior surgeries for cleft lip and palate repair.  Moist mucous membranes, + palpable nodular thyromegaly R>L, no cervical lymphadenopathy Cardiovascular: normal precordial activity, Regular Rate and Rhythm, no Murmur/Rubs/Gallops Respiratory:  adequate breathing efforts, no gross chest deformity, Clear to auscultation bilaterally Gastrointestinal: abdomen soft, Non -tender, No distension, Bowel Sounds present Musculoskeletal: no gross deformities, strength intact in all four extremities Skin: moist, warm, no rashes Neurological: no tremor with outstretched hands   CMP ( most recent) CMP     Component Value Date/Time   NA 141 04/23/2017 1435   K 4.1 04/23/2017 1435   CL 107 04/23/2017 1435   CO2 26 04/23/2017 1435   GLUCOSE 94 04/23/2017 1435   BUN 18 04/23/2017 1435   CREATININE 0.79 04/23/2017 1435   CALCIUM 9.3 04/23/2017 1435   GFRNONAA >60 04/23/2017 1435   GFRAA >60 04/23/2017 1435   No recent thyroid function test to review. Ultrasound November 29, 2017: Right lobe measured 4.9 cm, left lobe measured 4.4 submitted.  The nodule on the right lobe measures 3.9 cm x  3.100 x 1.9 cm. Fine-needle aspiration on February 06, 2018 revealed atypia of undetermined significance or follicular lesion of undetermined significance (Bethesda category III) Molecular study on her aspirate using  Afirma labs reveals suspicious mass.  With risk of malignancy approximately 50%.  Complete report scanned into her records.   Assessment & Plan:   1.  Nodular goiter-abnormal FNA, abnormal Afirma cytopathology -Based on her completed thyroid work-up she has abnormal thyroid cytology involving atypia of undetermined significance or follicular lesion of undetermined significance carrying her average malignancy risk of 5-15%. -On her subsequent fine-needle aspiration with molecular testing increased her risk of malignancy to approximately 50%.  -  Given her relative young age at 63 years, and family history of thyroid cancer, the next best option will be total thyroidectomy with subsequent thyroid hormone replacement lifelong.   -Lifelong thyroid hormone replacement will present  a challenge given her suboptimal intelligence.    - I discussed this at length with her grandmother, who states that she will make sure arrangements are made for her to take the required hormone appropriately.  -I will refer this patient to Dr. Aviva Signs for total thyroidectomy.  She will return for follow-up 1 to 2 weeks after her surgery with TSH, free T4, CMP.  -I did not initiate any prescription today.  Follow up plan: Return in about 8 weeks (around 08/07/2018) for Follow up with Pre-visit Labs.   Glade Lloyd, MD [facility] [address] [place], [place] [address] Phone: [phone]  Fax: [phone]     06/12/2018, 4:35 PM  This note was partially dictated with voice recognition software. Similar sounding words can be transcribed inadequately or may not  be corrected upon review.

## 2018-07-11 ENCOUNTER — Encounter: Payer: Self-pay | Admitting: General Surgery

## 2018-07-11 ENCOUNTER — Ambulatory Visit (INDEPENDENT_AMBULATORY_CARE_PROVIDER_SITE_OTHER): Payer: Medicaid Other | Admitting: General Surgery

## 2018-07-11 VITALS — BP 120/64 | HR 59 | Temp 97.7°F | Resp 18 | Wt 114.0 lb

## 2018-07-11 DIAGNOSIS — D497 Neoplasm of unspecified behavior of endocrine glands and other parts of nervous system: Secondary | ICD-10-CM | POA: Diagnosis not present

## 2018-07-11 NOTE — Patient Instructions (Signed)
Thyroidectomy A thyroidectomy is a surgery that is done to remove all (total thyroidectomy) or part (subtotal thyroidectomy) of your thyroid gland. The thyroid is a butterfly-shaped gland that is located at the lower front of your neck. It produces a substance that helps to control certain body processes (thyroid hormone). The amount of thyroid gland tissue that is removed during your thyroidectomy depends on the reason you need the procedure. You may have a thyroidectomy to treat conditions including:  Thyroid nodules.  Thyroid cancer.  Benign thyroid tumors.  Goiter.  Overactive thyroid gland (hyperthyroidism).  There are different ways to do a thyroidectomy:  Conventional thyroidectomy (open thyroidectomy). This procedure is the most common. In this procedure, the thyroid gland is removed through one surgical cut (incision) in the neck.  Endoscopic thyroidectomy. This procedure is less invasive. In this procedure, there may be several smaller incisions in the neck, chest, or armpit. The surgeon uses a tiny camera and other assistive tools to remove the thyroid gland.  Tell a health care provider about:  Any allergies you have.  All medicines you are taking, including vitamins, herbs, eye drops, creams, and over-the-counter medicines.  Any problems you or family members have had with anesthetic medicines.  Any blood disorders you have.  Any surgeries you have had.  Any medical conditions you have. What are the risks? Generally, this is a safe procedure. However, problems can occur and include:  A decrease in parathyroid hormone levels (hypoparathyroidism). Your parathyroid glands are located behind your thyroid gland, and they maintain the calcium level in your body. If these glands are damaged during surgery, your calcium level will drop. This will make your nerves irritable and cause muscle spasms.  An increase in thyroid hormone.  Damage to the nerves of your voice box  (larynx).  Bleeding.  Infection at the site of the incision or incisions.  Temporary breathing difficulties. This is a very rare complication. It usually goes away within weeks.  What happens before the procedure?  Your health care provider will perform a physical exam and assess your voice for vocal changes.  Ask your health care provider about: ? Changing or stopping your regular medicines. This is especially important if you are taking diabetes medicines or blood thinners. ? Taking medicines such as aspirin and ibuprofen. These medicines can thin your blood. Do not take these medicines before your procedure if your health care provider instructs you not to.  Follow instructions from your health care provider about eating or drinking restrictions. What happens during the procedure? You will be given a medicine that makes you go to sleep (general anesthetic). Depending on which type of thyroidectomy you have, this is what may happen during the procedure: Conventional Thyroidectomy  The surgeon will make an incision in the center of your lower neck.  The muscles in your neck will be separated to reveal your thyroid gland.  Part or all of your thyroid gland will be removed.  You may need a tube (catheter) at the incision site to drain blood and fluids that accumulate under the skin after the procedure.The catheter may have to stay in place for a day or two after the procedure.  The incision will be closed with stitches (sutures). Endoscopic Thyroidectomy  The surgeon will make several small incisions in your neck, chest, or armpit.  The surgeon will use a narrow tube with a light and camera at the end (endoscope). The surgeon will insert the endoscope into an incision.  Part or   all of your thyroid gland will be removed.  You may need a catheter at the incision site to drain blood and fluids that accumulate under the skin after the procedure.The catheter may have to stay in  place for a day or two after the procedure.  The incision will be closed with sutures. What happens after the procedure?  Your blood pressure, heart rate, breathing rate, and blood oxygen level will be monitored often until the medicines you were given have worn off.  Depending on the type of thyroidectomy you had, you may have: ? A swollen neck. ? Some mild neck pain. ? A slightly sore throat. ? A weak voice.  You will not be able to eat or drink until your health care provider says it is okay.  You may have a blood test to check the level of calcium in your body.  If you had a catheter put in during the procedure, it will usually be removed the next day. This information is not intended to replace advice given to you by your health care provider. Make sure you discuss any questions you have with your health care provider. Document Released: 04/18/2001 Document Revised: 06/25/2016 Document Reviewed: 03/25/2014 Elsevier Interactive Patient Education  [employer].

## 2018-07-11 NOTE — H&P (Signed)
Sherri Wiggins; [id number]; [birth year]   HPI Patient is a 22 year old female who was referred to my care by Dr. 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  Patient has limited mental capacity and thus cannot give a full system review.  Patient is to take care of by non-biological parents who have been taking care of her since she was a preemie.  They deny that she has any cardiopulmonary difficulties or genetic disorder.  There is a history of thyroid cancer in an aunt.  There is no history of heart palpitations, weight changes, heat intolerance.  She currently has no dysphasia or airway difficulties. Past Medical History:  Diagnosis Date  . Asthma    as child  . 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 Retinopathy   . Seizures ([place])    One after oral surgery 10 years ago. On no meds, no more seizures  . Thyroid disease    enlarged    Past Surgical History:  Procedure Laterality Date  . BIOPSY THYROID     x 2  . CATARACT EXTRACTION W/PHACO Bilateral 04/30/2017   Procedure: CATARACT EXTRACTION PHACO AND INTRAOCULAR LENS PLACEMENT (IOC);  Surgeon: Tonny Branch, MD;  Location: [facility];  Service: Ophthalmology;  Laterality: Bilateral;  LEFT EYE CDE: 0.46 RIGHT EYE CDE: 0.00  . EYE SURGERY     laser  . HERNIA REPAIR Right   . RHINOPLASTY FOR CLEFT LIP / PALATE    . STRABISMUS SURGERY    . TYMPANOSTOMY TUBE PLACEMENT      Family History  Problem Relation Age of Onset  . Cancer Maternal Grandmother   . Hypertension Maternal Grandfather     Current Outpatient Medications on File Prior to Visit  Medication Sig Dispense Refill  . Norethin Ace-Eth Estrad-FE (TAYTULLA) 1-20 MG-MCG(24) CAPS Take 1 tablet by mouth daily. 28 capsule 12   No current facility-administered medications on file prior to visit.     No Known Allergies  Social History   Substance and Sexual Activity  Alcohol Use Not Currently     Social History   Tobacco Use  Smoking Status Never Smoker  Smokeless Tobacco Never Used    Review of Systems  Unable to perform ROS: Mental acuity    Objective   Vitals:   07/11/18 1022  BP: 120/64  Pulse: (!) 59  Resp: 18  Temp: 97.7 F (36.5 C)    Physical Exam  Constitutional: She is oriented to person, place, and time. She appears well-developed and well-nourished. No distress.  HENT:  Head: Atraumatic.  Neck: Normal range of motion. Neck supple. No tracheal deviation present.  Enlarged, prominent, firm right lobe of thyroid gland.  No significant tracheal deviation noted.  Cardiovascular: Normal rate, regular rhythm and normal heart sounds. Exam reveals no friction rub.  No murmur heard. Pulmonary/Chest: Effort normal and breath sounds normal. No stridor. No respiratory distress. She has no wheezes. She has no rales.  Lymphadenopathy:    She has no cervical adenopathy.  Neurological: She is alert and oriented to person, place, and time.  Skin: Skin is warm and dry.  Dr. Liliane Channel notes reviewed.  Ultrasound pathology reports reviewed.  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  Risks and benefits of the procedure including bleeding, infection, voice  changes, and the possibility of malignancy were fully explained to the patient's parents, who understand and agreed to the procedure.  They do provide consent for the patient given her mental acuity.  Surgery is scheduled for 07/24/2018.

## 2018-07-11 NOTE — Progress Notes (Signed)
Sherri Wiggins; [id number]; 09/18/1996   HPI Patient is a 22 year old female who was referred to my care by Dr. 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  Patient has limited mental capacity and thus cannot give a full system review.  Patient is to take care of by non-biological parents who have been taking care of her since she was a preemie.  They deny that she has any cardiopulmonary difficulties or genetic disorder.  There is a history of thyroid cancer in an aunt.  There is no history of heart palpitations, weight changes, heat intolerance.  She currently has no dysphasia or airway difficulties. Past Medical History:  Diagnosis Date  . Asthma    as child  . 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 Retinopathy   . Seizures ([place])    One after oral surgery 10 years ago. On no meds, no more seizures  . Thyroid disease    enlarged    Past Surgical History:  Procedure Laterality Date  . BIOPSY THYROID     x 2  . CATARACT EXTRACTION W/PHACO Bilateral 04/30/2017   Procedure: CATARACT EXTRACTION PHACO AND INTRAOCULAR LENS PLACEMENT (IOC);  Surgeon: Tonny Branch, MD;  Location: [facility];  Service: Ophthalmology;  Laterality: Bilateral;  LEFT EYE CDE: 0.46 RIGHT EYE CDE: 0.00  . EYE SURGERY     laser  . HERNIA REPAIR Right   . RHINOPLASTY FOR CLEFT LIP / PALATE    . STRABISMUS SURGERY    . TYMPANOSTOMY TUBE PLACEMENT      Family History  Problem Relation Age of Onset  . Cancer Maternal Grandmother   . Hypertension Maternal Grandfather     Current Outpatient Medications on File Prior to Visit  Medication Sig Dispense Refill  . Norethin Ace-Eth Estrad-FE (TAYTULLA) 1-20 MG-MCG(24) CAPS Take 1 tablet by mouth daily. 28 capsule 12   No current facility-administered medications on file prior to visit.     No Known Allergies  Social History   Substance and Sexual Activity  Alcohol Use Not Currently     Social History   Tobacco Use  Smoking Status Never Smoker  Smokeless Tobacco Never Used    Review of Systems  Unable to perform ROS: Mental acuity    Objective   Vitals:   07/11/18 1022  BP: 120/64  Pulse: (!) 59  Resp: 18  Temp: 97.7 F (36.5 C)    Physical Exam  Constitutional: She is oriented to person, place, and time. She appears well-developed and well-nourished. No distress.  HENT:  Head: Atraumatic.  Neck: Normal range of motion. Neck supple. No tracheal deviation present.  Enlarged, prominent, firm right lobe of thyroid gland.  No significant tracheal deviation noted.  Cardiovascular: Normal rate, regular rhythm and normal heart sounds. Exam reveals no friction rub.  No murmur heard. Pulmonary/Chest: Effort normal and breath sounds normal. No stridor. No respiratory distress. She has no wheezes. She has no rales.  Lymphadenopathy:    She has no cervical adenopathy.  Neurological: She is alert and oriented to person, place, and time.  Skin: Skin is warm and dry.  Dr. Liliane Channel notes reviewed.  Ultrasound pathology reports reviewed.  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  Risks and benefits of the procedure including bleeding, infection, voice  changes, and the possibility of malignancy were fully explained to the patient's parents, who understand and agreed to the procedure.  They do provide consent for the patient given her mental acuity.  Surgery is scheduled for 07/24/2018.

## 2018-07-15 NOTE — Patient Instructions (Signed)
Your procedure is scheduled on: 07/24/2018  Report to [facility] at  6:15   AM.  Call this number if you have problems the morning of surgery: [phone]   Remember:   Do not drink or eat food:After Midnight.  :  Take these medicines the morning of surgery with A SIP OF WATER: None   Do not wear jewelry, make-up or nail polish.  Do not wear lotions, powders, or perfumes. You may wear deodorant.  Do not shave 48 hours prior to surgery. Men may shave face and neck.  Do not bring valuables to the hospital.  Contacts, dentures or bridgework may not be worn into surgery.  Leave suitcase in the car. After surgery it may be brought to your room.  For patients admitted to the hospital, checkout time is 11:00 AM the day of discharge.   Patients discharged the day of surgery will not be allowed to drive home.    Special Instructions: Shower using CHG night before surgery and shower the day of surgery use CHG.  Use special wash - you have one bottle of CHG for all showers.  You should use approximately 1/2 of the bottle for each shower.  Thyroidectomy A thyroidectomy is a surgery that is done to remove all (total thyroidectomy) or part (subtotal thyroidectomy) of your thyroid gland. The thyroid is a butterfly-shaped gland that is located at the lower front of your neck. It produces a substance that helps to control certain body processes (thyroid hormone). The amount of thyroid gland tissue that is removed during your thyroidectomy depends on the reason you need the procedure. You may have a thyroidectomy to treat conditions including:  Thyroid nodules.  Thyroid cancer.  Benign thyroid tumors.  Goiter.  Overactive thyroid gland (hyperthyroidism).  There are different ways to do a thyroidectomy:  Conventional thyroidectomy (open thyroidectomy). This procedure is the most common. In this procedure, the thyroid gland is removed through one surgical cut (incision) in the  neck.  Endoscopic thyroidectomy. This procedure is less invasive. In this procedure, there may be several smaller incisions in the neck, chest, or armpit. The surgeon uses a tiny camera and other assistive tools to remove the thyroid gland.  Tell a health care provider about:  Any allergies you have.  All medicines you are taking, including vitamins, herbs, eye drops, creams, and over-the-counter medicines.  Any problems you or family members have had with anesthetic medicines.  Any blood disorders you have.  Any surgeries you have had.  Any medical conditions you have. What are the risks? Generally, this is a safe procedure. However, problems can occur and include:  A decrease in parathyroid hormone levels (hypoparathyroidism). Your parathyroid glands are located behind your thyroid gland, and they maintain the calcium level in your body. If these glands are damaged during surgery, your calcium level will drop. This will make your nerves irritable and cause muscle spasms.  An increase in thyroid hormone.  Damage to the nerves of your voice box (larynx).  Bleeding.  Infection at the site of the incision or incisions.  Temporary breathing difficulties. This is a very rare complication. It usually goes away within weeks.  What happens before the procedure?  Your health care provider will perform a physical exam and assess your voice for vocal changes.  Ask your health care provider about: ? Changing or stopping your regular medicines. This is especially important if you are taking diabetes medicines or blood thinners. ? Taking medicines such as aspirin  and ibuprofen. These medicines can thin your blood. Do not take these medicines before your procedure if your health care provider instructs you not to.  Follow instructions from your health care provider about eating or drinking restrictions. What happens during the procedure? You will be given a medicine that makes you go to  sleep (general anesthetic). Depending on which type of thyroidectomy you have, this is what may happen during the procedure: Conventional Thyroidectomy  The surgeon will make an incision in the center of your lower neck.  The muscles in your neck will be separated to reveal your thyroid gland.  Part or all of your thyroid gland will be removed.  You may need a tube (catheter) at the incision site to drain blood and fluids that accumulate under the skin after the procedure.The catheter may have to stay in place for a day or two after the procedure.  The incision will be closed with stitches (sutures). Endoscopic Thyroidectomy  The surgeon will make several small incisions in your neck, chest, or armpit.  The surgeon will use a narrow tube with a light and camera at the end (endoscope). The surgeon will insert the endoscope into an incision.  Part or all of your thyroid gland will be removed.  You may need a catheter at the incision site to drain blood and fluids that accumulate under the skin after the procedure.The catheter may have to stay in place for a day or two after the procedure.  The incision will be closed with sutures. What happens after the procedure?  Your blood pressure, heart rate, breathing rate, and blood oxygen level will be monitored often until the medicines you were given have worn off.  Depending on the type of thyroidectomy you had, you may have: ? A swollen neck. ? Some mild neck pain. ? A slightly sore throat. ? A weak voice.  You will not be able to eat or drink until your health care provider says it is okay.  You may have a blood test to check the level of calcium in your body.  If you had a catheter put in during the procedure, it will usually be removed the next day. This information is not intended to replace advice given to you by your health care provider. Make sure you discuss any questions you have with your health care provider. Document  Released: 04/18/2001 Document Revised: 06/25/2016 Document Reviewed: 03/25/2014 Elsevier Interactive Patient Education  2018 [place]. Thyroidectomy, Care After Refer to this sheet in the next few weeks. These instructions provide you with information about caring for yourself after your procedure. Your health care provider may also give you more specific instructions. Your treatment has been planned according to current medical practices, but problems sometimes occur. Call your health care provider if you have any problems or questions after your procedure. What can I expect after the procedure? After your procedure, it is typical to have:  Mild pain in the neck or upper body, especially when swallowing.  A sore throat.  A weak voice.  Follow these instructions at home:  Take medicines only as directed by your health care provider.  If your entire thyroid gland was removed, you may need to take thyroid hormone medicine from now on.  Do not take medicines that contain aspirin and ibuprofen until your health care provider says that you can. These medicines can increase your risk of bleeding.  Some pain medicines cause constipation. Drink enough fluid to keep your urine clear  or pale yellow. This can help to prevent constipation.  Start slowly with eating. You may need to have only liquids and soft foods for a few days or as directed by your health care provider.  Do not take baths, swim, or use a hot tub until your health care provider approves.  There are many different ways to close and cover an incision, including stitches (sutures), skin glue, and adhesive strips. Follow your health care provider's instructions for: ? Incision care. ? Bandage (dressing) changes and removal. ? Incision closure removal.  Resume your usual activities as directed by your health care provider.  For the first 10 days after the procedure or as instructed by your health care provider: ? Do not lift  anything heavier than 20 lb (9.1 kg). ? Do not jog, swim, or do other strenuous exercises. ? Do not play contact sports.  Keep all follow-up visits as directed by your health care provider. This is important. Contact a health care provider if:  The soreness in your throat gets worse.  You have increased pain at your incision or incisions.  You have increased bleeding from an incision.  Your incision becomes infected. Watch for: ? Swelling. ? Redness. ? Warmth. ? Pus.  You notice a bad smell coming from an incision or dressing.  You have a fever.  You feel lightheaded or faint.  You have numbness, tingling, or muscle spasms in your: ? Arms. ? Hands. ? Feet. ? Face.  You have trouble swallowing. Get help right away if:  You develop a rash.  You have difficulty breathing.  You hear whistling noises coming from your chest.  You develop a cough that gets worse.  Your speech changes, or you have hoarseness that gets worse. This information is not intended to replace advice given to you by your health care provider. Make sure you discuss any questions you have with your health care provider. Document Released: 05/12/2005 Document Revised: 06/25/2016 Document Reviewed: 03/25/2014 Elsevier Interactive Patient Education  2018 [place] Anesthesia, Adult, Care After These instructions provide you with information about caring for yourself after your procedure. Your health care provider may also give you more specific instructions. Your treatment has been planned according to current medical practices, but problems sometimes occur. Call your health care provider if you have any problems or questions after your procedure. What can I expect after the procedure? After the procedure, it is common to have:  Vomiting.  A sore throat.  Mental slowness.  It is common to feel:  Nauseous.  Cold or shivery.  Sleepy.  Tired.  Sore or achy, even in parts of your  body where you did not have surgery.  Follow these instructions at home: For at least 24 hours after the procedure:  Do not: ? Participate in activities where you could fall or become injured. ? Drive. ? Use heavy machinery. ? Drink alcohol. ? Take sleeping pills or medicines that cause drowsiness. ? Make important decisions or sign legal documents. ? Take care of children on your own.  Rest. Eating and drinking  If you vomit, drink water, juice, or soup when you can drink without vomiting.  Drink enough fluid to keep your urine clear or pale yellow.  Make sure you have little or no nausea before eating solid foods.  Follow the diet recommended by your health care provider. General instructions  Have a responsible adult stay with you until you are awake and alert.  Return to your normal  activities as told by your health care provider. Ask your health care provider what activities are safe for you.  Take over-the-counter and prescription medicines only as told by your health care provider.  If you smoke, do not smoke without supervision.  Keep all follow-up visits as told by your health care provider. This is important. Contact a health care provider if:  You continue to have nausea or vomiting at home, and medicines are not helpful.  You cannot drink fluids or start eating again.  You cannot urinate after 8-12 hours.  You develop a skin rash.  You have fever.  You have increasing redness at the site of your procedure. Get help right away if:  You have difficulty breathing.  You have chest pain.  You have unexpected bleeding.  You feel that you are having a life-threatening or urgent problem. This information is not intended to replace advice given to you by your health care provider. Make sure you discuss any questions you have with your health care provider. Document Released: 01/29/2001 Document Revised: 03/27/2016 Document Reviewed: 10/07/2015 Elsevier  Interactive Patient Education  [employer].

## 2018-07-18 ENCOUNTER — Encounter (HOSPITAL_COMMUNITY)
Admission: RE | Admit: 2018-07-18 | Discharge: 2018-07-18 | Disposition: A | Discharge status: Home / Self Care | Payer: Medicaid Other | Source: Ambulatory Visit | Attending: General Surgery | Admitting: General Surgery

## 2018-07-18 ENCOUNTER — Other Ambulatory Visit: Payer: Self-pay

## 2018-07-18 ENCOUNTER — Encounter (HOSPITAL_COMMUNITY): Payer: Self-pay

## 2018-07-18 DIAGNOSIS — Z01812 Encounter for preprocedural laboratory examination: Secondary | ICD-10-CM | POA: Insufficient documentation

## 2018-07-18 LAB — CBC WITH DIFFERENTIAL/PLATELET
BASOS PCT: 1 %
Basophils Absolute: 0.1 10*3/uL (ref 0.0–0.1)
Eosinophils Absolute: 0.3 10*3/uL (ref 0.0–0.7)
Eosinophils Relative: 4 %
HCT: 40.9 % (ref 36.0–46.0)
Hemoglobin: 13.9 g/dL (ref 12.0–15.0)
LYMPHS ABS: 2.6 10*3/uL (ref 0.7–4.0)
Lymphocytes Relative: 32 %
MCH: 29.8 pg (ref 26.0–34.0)
MCHC: 34 g/dL (ref 30.0–36.0)
MCV: 87.6 fL (ref 78.0–100.0)
MONO ABS: 0.6 10*3/uL (ref 0.1–1.0)
MONOS PCT: 7 %
NEUTROS ABS: 4.5 10*3/uL (ref 1.7–7.7)
Neutrophils Relative %: 56 %
Platelets: 330 10*3/uL (ref 150–400)
RBC: 4.67 MIL/uL (ref 3.87–5.11)
RDW: 12 % (ref 11.5–15.5)
WBC: 8 10*3/uL (ref 4.0–10.5)

## 2018-07-18 LAB — COMPREHENSIVE METABOLIC PANEL
ALBUMIN: 3.9 g/dL (ref 3.5–5.0)
ALT: 6 U/L (ref 0–44)
ANION GAP: 7 (ref 5–15)
AST: 16 U/L (ref 15–41)
Alkaline Phosphatase: 44 U/L (ref 38–126)
BUN: 11 mg/dL (ref 6–20)
CO2: 25 mmol/L (ref 22–32)
Calcium: 8.8 mg/dL — ABNORMAL LOW (ref 8.9–10.3)
Chloride: 106 mmol/L (ref 98–111)
Creatinine, Ser: 0.81 mg/dL (ref 0.44–1.00)
Glucose, Bld: 108 mg/dL — ABNORMAL HIGH (ref 70–99)
POTASSIUM: 3.8 mmol/L (ref 3.5–5.1)
Sodium: 138 mmol/L (ref 135–145)
TOTAL PROTEIN: 7.6 g/dL (ref 6.5–8.1)
Total Bilirubin: 1 mg/dL (ref 0.3–1.2)

## 2018-07-18 LAB — HCG, SERUM, QUALITATIVE: Preg, Serum: NEGATIVE

## 2018-07-24 ENCOUNTER — Other Ambulatory Visit: Payer: Self-pay

## 2018-07-24 ENCOUNTER — Ambulatory Visit (HOSPITAL_COMMUNITY): Payer: Medicaid Other | Admitting: Anesthesiology

## 2018-07-24 ENCOUNTER — Encounter (HOSPITAL_COMMUNITY): Payer: Self-pay | Admitting: Anesthesiology

## 2018-07-24 ENCOUNTER — Encounter (HOSPITAL_COMMUNITY)
Admission: RE | Disposition: A | Discharge status: Home / Self Care | Payer: Self-pay | Source: Ambulatory Visit | Attending: General Surgery

## 2018-07-24 ENCOUNTER — Observation Stay (HOSPITAL_COMMUNITY)
Admission: RE | Admit: 2018-07-24 | Discharge: 2018-07-25 | Disposition: A | Discharge status: Home / Self Care | Payer: Medicaid Other | Source: Ambulatory Visit | Attending: General Surgery | Admitting: General Surgery

## 2018-07-24 DIAGNOSIS — Z9889 Other specified postprocedural states: Secondary | ICD-10-CM

## 2018-07-24 DIAGNOSIS — Z23 Encounter for immunization: Secondary | ICD-10-CM | POA: Insufficient documentation

## 2018-07-24 DIAGNOSIS — F70 Mild intellectual disabilities: Secondary | ICD-10-CM | POA: Diagnosis not present

## 2018-07-24 DIAGNOSIS — J45909 Unspecified asthma, uncomplicated: Secondary | ICD-10-CM | POA: Diagnosis not present

## 2018-07-24 DIAGNOSIS — Z79899 Other long term (current) drug therapy: Secondary | ICD-10-CM | POA: Diagnosis not present

## 2018-07-24 DIAGNOSIS — C73 Malignant neoplasm of thyroid gland: Secondary | ICD-10-CM | POA: Diagnosis present

## 2018-07-24 DIAGNOSIS — D497 Neoplasm of unspecified behavior of endocrine glands and other parts of nervous system: Secondary | ICD-10-CM | POA: Diagnosis not present

## 2018-07-24 DIAGNOSIS — E890 Postprocedural hypothyroidism: Secondary | ICD-10-CM

## 2018-07-24 HISTORY — PX: THYROIDECTOMY: SHX17

## 2018-07-24 LAB — COMPREHENSIVE METABOLIC PANEL
ALT: 8 U/L (ref 0–44)
ANION GAP: 7 (ref 5–15)
AST: 15 U/L (ref 15–41)
Albumin: 3.6 g/dL (ref 3.5–5.0)
Alkaline Phosphatase: 38 U/L (ref 38–126)
BUN: 12 mg/dL (ref 6–20)
CHLORIDE: 106 mmol/L (ref 98–111)
CO2: 24 mmol/L (ref 22–32)
CREATININE: 0.87 mg/dL (ref 0.44–1.00)
Calcium: 8.3 mg/dL — ABNORMAL LOW (ref 8.9–10.3)
GFR calc Af Amer: >60 mL/min (ref >60)
Glucose, Bld: 91 mg/dL (ref 70–99)
Potassium: 3.9 mmol/L (ref 3.5–5.1)
Sodium: 137 mmol/L (ref 135–145)
Total Bilirubin: 2.4 mg/dL — ABNORMAL HIGH (ref 0.3–1.2)
Total Protein: 6.9 g/dL (ref 6.5–8.1)

## 2018-07-24 SURGERY — THYROIDECTOMY
Anesthesia: General | Site: Neck

## 2018-07-24 MED ORDER — SODIUM CHLORIDE 0.9 % IV SOLN
INTRAVENOUS | Status: DC
  Administered 2018-07-24: 1000 mL via INTRAVENOUS
  Administered 2018-07-24 – 2018-07-25 (×2): via INTRAVENOUS

## 2018-07-24 MED ORDER — KETOROLAC TROMETHAMINE 30 MG/ML IJ SOLN: 30.0000 mg | Freq: Four times a day (QID) | INTRAMUSCULAR | Status: DC | PRN

## 2018-07-24 MED ORDER — DIPHENHYDRAMINE HCL 50 MG/ML IJ SOLN: 25.0000 mg | Freq: Four times a day (QID) | INTRAMUSCULAR | Status: DC | PRN

## 2018-07-24 MED ORDER — MORPHINE SULFATE (PF) 2 MG/ML IV SOLN
1.0000 mg | INTRAVENOUS | Status: DC | PRN
  Administered 2018-07-24 – 2018-07-25 (×5): 1 mg via INTRAVENOUS
  Filled 2018-07-24 (×5): qty 1

## 2018-07-24 MED ORDER — MIDAZOLAM HCL 5 MG/5ML IJ SOLN
INTRAMUSCULAR | Status: DC | PRN
  Administered 2018-07-24: 2 mg via INTRAVENOUS

## 2018-07-24 MED ORDER — BUPIVACAINE LIPOSOME 1.3 % IJ SUSP
INTRAMUSCULAR | Status: AC
  Filled 2018-07-24: qty 20

## 2018-07-24 MED ORDER — ACETAMINOPHEN 325 MG PO TABS: 650.0000 mg | ORAL_TABLET | Freq: Four times a day (QID) | ORAL | Status: DC | PRN

## 2018-07-24 MED ORDER — SUCCINYLCHOLINE CHLORIDE 20 MG/ML IJ SOLN
INTRAMUSCULAR | Status: AC
  Filled 2018-07-24: qty 1

## 2018-07-24 MED ORDER — ONDANSETRON HCL 4 MG/2ML IJ SOLN: 4.0000 mg | Freq: Once | INTRAMUSCULAR | Status: DC | PRN

## 2018-07-24 MED ORDER — ACETAMINOPHEN 650 MG RE SUPP: 650.0000 mg | Freq: Four times a day (QID) | RECTAL | Status: DC | PRN

## 2018-07-24 MED ORDER — MIDAZOLAM HCL 2 MG/2ML IJ SOLN
INTRAMUSCULAR | Status: AC
  Filled 2018-07-24: qty 2

## 2018-07-24 MED ORDER — HEMOSTATIC AGENTS (NO CHARGE) OPTIME
TOPICAL | Status: DC | PRN
  Administered 2018-07-24 (×2): 1 via TOPICAL

## 2018-07-24 MED ORDER — SIMETHICONE 80 MG PO CHEW: 40.0000 mg | CHEWABLE_TABLET | Freq: Four times a day (QID) | ORAL | Status: DC | PRN

## 2018-07-24 MED ORDER — ONDANSETRON HCL 4 MG/2ML IJ SOLN: 4.0000 mg | Freq: Four times a day (QID) | INTRAMUSCULAR | Status: DC | PRN

## 2018-07-24 MED ORDER — SUGAMMADEX SODIUM 200 MG/2ML IV SOLN
INTRAVENOUS | Status: AC
  Filled 2018-07-24: qty 2

## 2018-07-24 MED ORDER — ROCURONIUM BROMIDE 50 MG/5ML IV SOLN
INTRAVENOUS | Status: AC
  Filled 2018-07-24: qty 1

## 2018-07-24 MED ORDER — HYDROCODONE-ACETAMINOPHEN 5-325 MG PO TABS
1.0000 | ORAL_TABLET | ORAL | Status: DC | PRN
  Administered 2018-07-25 (×2): 1 via ORAL
  Filled 2018-07-24 (×2): qty 1

## 2018-07-24 MED ORDER — LIDOCAINE HCL 1 % IJ SOLN
INTRAMUSCULAR | Status: DC | PRN
  Administered 2018-07-24: 25 mg via INTRADERMAL

## 2018-07-24 MED ORDER — KETOROLAC TROMETHAMINE 30 MG/ML IJ SOLN
30.0000 mg | Freq: Four times a day (QID) | INTRAMUSCULAR | Status: AC
  Administered 2018-07-24: 30 mg via INTRAVENOUS

## 2018-07-24 MED ORDER — ROCURONIUM BROMIDE 100 MG/10ML IV SOLN
INTRAVENOUS | Status: DC | PRN
  Administered 2018-07-24: 35 mg via INTRAVENOUS

## 2018-07-24 MED ORDER — SUGAMMADEX SODIUM 200 MG/2ML IV SOLN
INTRAVENOUS | Status: DC | PRN
  Administered 2018-07-24: 150 mg via INTRAVENOUS

## 2018-07-24 MED ORDER — LACTATED RINGERS IV SOLN
INTRAVENOUS | Status: DC
  Administered 2018-07-24: 07:00:00 via INTRAVENOUS

## 2018-07-24 MED ORDER — KETOROLAC TROMETHAMINE 30 MG/ML IJ SOLN: 30.0000 mg | Freq: Once | INTRAMUSCULAR | Status: DC | PRN

## 2018-07-24 MED ORDER — FENTANYL CITRATE (PF) 250 MCG/5ML IJ SOLN
INTRAMUSCULAR | Status: AC
  Filled 2018-07-24: qty 5

## 2018-07-24 MED ORDER — BUPIVACAINE LIPOSOME 1.3 % IJ SUSP
INTRAMUSCULAR | Status: DC | PRN
  Administered 2018-07-24: 5 mL

## 2018-07-24 MED ORDER — FENTANYL CITRATE (PF) 100 MCG/2ML IJ SOLN
INTRAMUSCULAR | Status: DC | PRN
  Administered 2018-07-24 (×5): 50 ug via INTRAVENOUS

## 2018-07-24 MED ORDER — LIDOCAINE HCL (PF) 1 % IJ SOLN
INTRAMUSCULAR | Status: AC
  Filled 2018-07-24: qty 2

## 2018-07-24 MED ORDER — KETOROLAC TROMETHAMINE 30 MG/ML IJ SOLN
INTRAMUSCULAR | Status: AC
  Filled 2018-07-24: qty 1

## 2018-07-24 MED ORDER — CHLORHEXIDINE GLUCONATE CLOTH 2 % EX PADS: 6.0000 | MEDICATED_PAD | Freq: Once | CUTANEOUS | Status: DC

## 2018-07-24 MED ORDER — HYDROMORPHONE HCL 1 MG/ML IJ SOLN: 0.2500 mg | INTRAMUSCULAR | Status: DC | PRN

## 2018-07-24 MED ORDER — 0.9 % SODIUM CHLORIDE (POUR BTL) OPTIME
TOPICAL | Status: DC | PRN
  Administered 2018-07-24: 1000 mL

## 2018-07-24 MED ORDER — PROPOFOL 10 MG/ML IV BOLUS
INTRAVENOUS | Status: AC
  Filled 2018-07-24: qty 20

## 2018-07-24 MED ORDER — ONDANSETRON 4 MG PO TBDP: 4.0000 mg | ORAL_TABLET | Freq: Four times a day (QID) | ORAL | Status: DC | PRN

## 2018-07-24 MED ORDER — DIPHENHYDRAMINE HCL 25 MG PO CAPS: 25.0000 mg | ORAL_CAPSULE | Freq: Four times a day (QID) | ORAL | Status: DC | PRN

## 2018-07-24 MED ORDER — SEVOFLURANE IN SOLN
RESPIRATORY_TRACT | Status: AC
  Filled 2018-07-24: qty 250

## 2018-07-24 MED ORDER — ONDANSETRON HCL 4 MG/2ML IJ SOLN
INTRAMUSCULAR | Status: DC | PRN
  Administered 2018-07-24: 4 mg via INTRAVENOUS

## 2018-07-24 MED ORDER — PROPOFOL 10 MG/ML IV BOLUS
INTRAVENOUS | Status: DC | PRN
  Administered 2018-07-24: 130 mg via INTRAVENOUS

## 2018-07-24 MED ORDER — HYDROCODONE-ACETAMINOPHEN 7.5-325 MG PO TABS: 1.0000 | ORAL_TABLET | Freq: Once | ORAL | Status: DC | PRN

## 2018-07-24 MED ORDER — MEPERIDINE HCL 50 MG/ML IJ SOLN: 6.2500 mg | INTRAMUSCULAR | Status: DC | PRN

## 2018-07-24 SURGICAL SUPPLY — 55 items
APPLIER CLIP 11 MED OPEN (CLIP) ×3
APPLIER CLIP 9.375 SM OPEN (CLIP) ×3
ATTRACTOMAT 16X20 MAGNETIC DRP (DRAPES) ×3 IMPLANT
BLADE SURG 15 STRL LF DISP TIS (BLADE) ×1 IMPLANT
BLADE SURG 15 STRL SS (BLADE) ×2
BLADE SURG SZ10 CARB STEEL (BLADE) ×3 IMPLANT
CHLORAPREP W/TINT 10.5 ML (MISCELLANEOUS) ×3 IMPLANT
CLIP APPLIE 11 MED OPEN (CLIP) ×1 IMPLANT
CLIP APPLIE 9.375 SM OPEN (CLIP) ×1 IMPLANT
CLOTH BEACON ORANGE TIMEOUT ST (SAFETY) ×3 IMPLANT
COVER LIGHT HANDLE STERIS (MISCELLANEOUS) ×6 IMPLANT
DERMABOND ADVANCED (GAUZE/BANDAGES/DRESSINGS) ×2
DERMABOND ADVANCED .7 DNX12 (GAUZE/BANDAGES/DRESSINGS) ×1 IMPLANT
DRAPE LAPAROTOMY 77X122 PED (DRAPES) ×3 IMPLANT
DRAPE PROXIMA HALF (DRAPES) ×6 IMPLANT
ELECT NEEDLE TIP 2.8 STRL (NEEDLE) ×3 IMPLANT
ELECT REM PT RETURN 9FT ADLT (ELECTROSURGICAL) ×3
ELECTRODE REM PT RTRN 9FT ADLT (ELECTROSURGICAL) ×1 IMPLANT
GAUZE 4X4 16PLY RFD (DISPOSABLE) ×9 IMPLANT
GLOVE BIOGEL PI IND STRL 7.0 (GLOVE) ×2 IMPLANT
GLOVE BIOGEL PI INDICATOR 7.0 (GLOVE) ×4
GLOVE ECLIPSE 6.5 STRL STRAW (GLOVE) ×6 IMPLANT
GLOVE SURG SS PI 7.5 STRL IVOR (GLOVE) ×3 IMPLANT
GOWN STRL REUS W/ TWL LRG LVL3 (GOWN DISPOSABLE) ×2 IMPLANT
GOWN STRL REUS W/TWL LRG LVL3 (GOWN DISPOSABLE) ×7 IMPLANT
HEMOSTAT ARISTA ABSORB 1G (MISCELLANEOUS) ×3 IMPLANT
HEMOSTAT SURGICEL 4X8 (HEMOSTASIS) ×3 IMPLANT
KIT BLADEGUARD II DBL (SET/KITS/TRAYS/PACK) ×3 IMPLANT
KIT TURNOVER KIT A (KITS) ×3 IMPLANT
MANIFOLD NEPTUNE II (INSTRUMENTS) ×3 IMPLANT
MARKER SKIN DUAL TIP RULER LAB (MISCELLANEOUS) ×3 IMPLANT
NEEDLE HYPO 21X1.5 SAFETY (NEEDLE) ×3 IMPLANT
NS IRRIG 1000ML POUR BTL (IV SOLUTION) ×3 IMPLANT
PACK BASIC III (CUSTOM PROCEDURE TRAY) ×2
PACK SRG BSC III STRL LF ECLPS (CUSTOM PROCEDURE TRAY) ×1 IMPLANT
PAD ARMBOARD 7.5X6 YLW CONV (MISCELLANEOUS) ×3 IMPLANT
PENCIL HANDSWITCHING (ELECTRODE) ×3 IMPLANT
SET BASIN LINEN APH (SET/KITS/TRAYS/PACK) ×3 IMPLANT
SHEARS HARMONIC 9CM CVD (BLADE) ×3 IMPLANT
SPONGE DRAIN TRACH 4X4 STRL 2S (GAUZE/BANDAGES/DRESSINGS) ×3 IMPLANT
SPONGE INTESTINAL PEANUT (DISPOSABLE) ×18 IMPLANT
STAPLER VISISTAT 35W (STAPLE) ×3 IMPLANT
SUT ETHILON 4 0 PS 2 18 (SUTURE) IMPLANT
SUT MNCRL AB 4-0 PS2 18 (SUTURE) ×3 IMPLANT
SUT SILK 2 0 (SUTURE) ×2
SUT SILK 2-0 18XBRD TIE 12 (SUTURE) ×1 IMPLANT
SUT SILK 3 0 (SUTURE) ×2
SUT SILK 3-0 18XBRD TIE 12 (SUTURE) ×1 IMPLANT
SUT VIC AB 2-0 CT2 27 (SUTURE) ×3 IMPLANT
SUT VIC AB 3-0 SH 27 (SUTURE) ×2
SUT VIC AB 3-0 SH 27X BRD (SUTURE) ×1 IMPLANT
SYR 20CC LL (SYRINGE) ×3 IMPLANT
SYSTEM CHEST DRAIN TLS 7FR (DRAIN) ×3 IMPLANT
TOWEL OR 17X26 4PK STRL BLUE (TOWEL DISPOSABLE) ×3 IMPLANT
YANKAUER SUCT BULB TIP 10FT TU (MISCELLANEOUS) ×3 IMPLANT

## 2018-07-24 NOTE — Anesthesia Procedure Notes (Signed)
Procedure Name: Intubation Date/Time: 07/24/2018 7:44 AM Performed by: Charmaine Downs, CRNA Pre-anesthesia Checklist: Patient identified, Patient being monitored, Timeout performed, Emergency Drugs available and Suction available Patient Re-evaluated:Patient Re-evaluated prior to induction Oxygen Delivery Method: Circle System Utilized Preoxygenation: Pre-oxygenation with 100% oxygen Induction Type: IV induction Ventilation: Mask ventilation without difficulty Laryngoscope Size: Mac and 3 Grade View: Grade I Tube type: Oral Tube size: 6.0 mm Number of attempts: 1 Airway Equipment and Method: stylet Placement Confirmation: ETT inserted through vocal cords under direct vision,  positive ETCO2 and breath sounds checked- equal and bilateral Secured at: 22 cm Tube secured with: Tape Dental Injury: Teeth and Oropharynx as per pre-operative assessment

## 2018-07-24 NOTE — Interval H&P Note (Signed)
History and Physical Interval Note:  07/24/2018 7:07 AM  Sherri Wiggins  has presented today for surgery, with the diagnosis of folliculer neoplasm  The various methods of treatment have been discussed with the patient and family. After consideration of risks, benefits and other options for treatment, the patient has consented to  Procedure(s): TOTAL THYROIDECTOMY (N/A) as a surgical intervention .  The patient's history has been reviewed, patient examined, no change in status, stable for surgery.  I have reviewed the patient's chart and labs.  Questions were answered to the patient's satisfaction.     Aviva Signs

## 2018-07-24 NOTE — Anesthesia Preprocedure Evaluation (Signed)
Anesthesia Evaluation  Patient identified by MRN, date of birth, ID band Patient awake    Reviewed: Allergy & Precautions, H&P , NPO status , Patient's Chart, lab work & pertinent test results  Airway Mallampati: II  TM Distance: >3 FB Neck ROM: full    Dental no notable dental hx.    Pulmonary neg pulmonary ROS, asthma ,    Pulmonary exam normal breath sounds clear to auscultation       Cardiovascular Exercise Tolerance: Good negative cardio ROS   Rhythm:regular Rate:Normal     Neuro/Psych Seizures -,  PSYCHIATRIC DISORDERS Intellectual disabilitynegative neurological ROS  negative psych ROS   GI/Hepatic negative GI ROS, Neg liver ROS,   Endo/Other  negative endocrine ROS  Renal/GU negative Renal ROS  negative genitourinary   Musculoskeletal   Abdominal   Peds  Hematology negative hematology ROS (+)   Anesthesia Other Findings Pregnancy Labs    (Last result in the past 90 days)   HCG Total HCG Qual Pregnancy Urine Pregnancy Serum 09/12 1519 NEGATIVE      Reproductive/Obstetrics negative OB ROS                             Anesthesia Physical Anesthesia Plan  ASA: II  Anesthesia Plan: General   Post-op Pain Management:    Induction:   PONV Risk Score and Plan:   Airway Management Planned:   Additional Equipment:   Intra-op Plan:   Post-operative Plan:   Informed Consent: I have reviewed the patients History and Physical, chart, labs and discussed the procedure including the risks, benefits and alternatives for the proposed anesthesia with the patient or authorized representative who has indicated his/her understanding and acceptance.   Dental Advisory Given  Plan Discussed with: CRNA  Anesthesia Plan Comments:         Anesthesia Quick Evaluation

## 2018-07-24 NOTE — Progress Notes (Signed)
EKG displays multifocal PVC's. BP 128/77. Dr Rick Duff notified. No new orders given. Strip placed on chart

## 2018-07-24 NOTE — Anesthesia Postprocedure Evaluation (Signed)
Anesthesia Post Note  Patient: Sherri Wiggins  Procedure(s) Performed: TOTAL THYROIDECTOMY (N/A Neck)  Patient location during evaluation: PACU Anesthesia Type: General Level of consciousness: awake and patient cooperative Pain management: pain level controlled Vital Signs Assessment: post-procedure vital signs reviewed and stable Respiratory status: spontaneous breathing, nonlabored ventilation and respiratory function stable Cardiovascular status: blood pressure returned to baseline Postop Assessment: no apparent nausea or vomiting Anesthetic complications: no     Last Vitals:  Vitals:   07/24/18 1000 07/24/18 1002  BP: 92/80 (!) 141/91  Pulse: 81 (!) 40  Resp: 18 18  Temp:    SpO2: 100% 96%    Last Pain:  Vitals:   07/24/18 0650  PainSc: 0-No pain                 Correna Meacham J

## 2018-07-24 NOTE — Progress Notes (Signed)
Family updated on pt status. Voiced understanding.

## 2018-07-24 NOTE — Op Note (Signed)
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  The patient now comes to the operating room for total thyroidectomy.  The risks and benefits of the procedure including bleeding, infection, nerve injury, and the possibility of malignancy were fully explained to the patient's power of attorney, who gave informed consent for the patient as the patient has decreased mental capacity.  Procedure note: The patient was placed in the supine position.  After induction of general endotracheal anesthesia, the neck was prepped and draped using the usual sterile technique with ChloraPrep.  Surgical site confirmation was performed.  A transverse incision was made just above the jugular notch.  The platysma was divided transversely without difficulty.  A superior flap was formed to the larynx and an inferior flap form to the jugular notch.  The strap muscles were divided longitudinally along the midline and then retracted laterally.  I first turned my attention to the right lobe of the thyroid gland.  It was very enlarged.  The thyroidal artery, middle thyroidal artery and vein, and the suspensory ligament of Gwenlyn Found were all identified and ligated without difficulty.  The lobe was then brought over to the midline.  Care was taken to avoid the right recurrent laryngeal nerve.  The left thyroid lobe was much smaller.  Again, the inferior thyroidal artery, middle thyroidal artery and vein, and the suspensory ligament of Berry were all divided using the harmonic scalpel.  The left recurrent laryngeal nerve was identified.  The specimen was then removed in total without difficulty.  Bleeding was  controlled using small clips.  Arista and Surgicel were placed in the thyroid bed.  The strap muscle was reapproximated using a 2-0 Vicryl running suture.  The platysma was reapproximated using a 3-0 Vicryl running suture.  Exparel was instilled into the surrounding wound.  The skin was closed using a 4-0 Monocryl subcuticular suture.  Dermabond was applied.  All tape and needle counts were correct at the end of the procedure.  The patient was transferred to PACU in stable condition.  She was able to phonate the letter E without difficulty.  Complications: None  EBL: 75 cc  Specimen: Thyroid

## 2018-07-24 NOTE — Transfer of Care (Signed)
Immediate Anesthesia Transfer of Care Note  Patient: Sherri Wiggins  Procedure(s) Performed: TOTAL THYROIDECTOMY (N/A Neck)  Patient Location: PACU  Anesthesia Type:General  Level of Consciousness: drowsy  Airway & Oxygen Therapy: Patient connected to tracheostomy mask oxygen  Post-op Assessment: Report given to RN, Post -op Vital signs reviewed and stable and Patient moving all extremities  Post vital signs: Reviewed and stable  Last Vitals:  Vitals Value Taken Time  BP    Temp    Pulse    Resp    SpO2      Last Pain:  Vitals:   07/24/18 0650  PainSc: 0-No pain         Complications: No apparent anesthesia complications

## 2018-07-25 ENCOUNTER — Encounter (HOSPITAL_COMMUNITY): Payer: Self-pay | Admitting: General Surgery

## 2018-07-25 DIAGNOSIS — C73 Malignant neoplasm of thyroid gland: Secondary | ICD-10-CM | POA: Diagnosis not present

## 2018-07-25 LAB — COMPREHENSIVE METABOLIC PANEL
ALK PHOS: 36 U/L — AB (ref 38–126)
ALT: 6 U/L (ref 0–44)
ANION GAP: 9 (ref 5–15)
AST: 13 U/L — AB (ref 15–41)
Albumin: 3.3 g/dL — ABNORMAL LOW (ref 3.5–5.0)
BILIRUBIN TOTAL: 2.5 mg/dL — AB (ref 0.3–1.2)
BUN: 13 mg/dL (ref 6–20)
CALCIUM: 8 mg/dL — AB (ref 8.9–10.3)
CO2: 23 mmol/L (ref 22–32)
Chloride: 107 mmol/L (ref 98–111)
Creatinine, Ser: 0.88 mg/dL (ref 0.44–1.00)
GFR calc Af Amer: >60 mL/min (ref >60)
GFR calc non Af Amer: >60 mL/min (ref >60)
Glucose, Bld: 85 mg/dL (ref 70–99)
POTASSIUM: 3.6 mmol/L (ref 3.5–5.1)
SODIUM: 139 mmol/L (ref 135–145)
TOTAL PROTEIN: 6.4 g/dL — AB (ref 6.5–8.1)

## 2018-07-25 LAB — CBC
HEMATOCRIT: 37.9 % (ref 36.0–46.0)
HEMOGLOBIN: 12.7 g/dL (ref 12.0–15.0)
MCH: 29.9 pg (ref 26.0–34.0)
MCHC: 33.5 g/dL (ref 30.0–36.0)
MCV: 89.2 fL (ref 78.0–100.0)
Platelets: 294 10*3/uL (ref 150–400)
RBC: 4.25 MIL/uL (ref 3.87–5.11)
RDW: 12 % (ref 11.5–15.5)
WBC: 8.9 10*3/uL (ref 4.0–10.5)

## 2018-07-25 MED ORDER — HYDROCODONE-ACETAMINOPHEN 5-325 MG PO TABS: 1.0000 | ORAL_TABLET | ORAL | 0 refills | Status: DC | PRN

## 2018-07-25 MED ORDER — LEVOTHYROXINE SODIUM 100 MCG PO TABS: 150.0000 ug | ORAL_TABLET | Freq: Every day | ORAL | 2 refills | Status: DC

## 2018-07-25 MED ORDER — INFLUENZA VAC SPLIT QUAD 0.5 ML IM SUSY
0.5000 mL | PREFILLED_SYRINGE | INTRAMUSCULAR | Status: AC
  Administered 2018-07-25: 0.5 mL via INTRAMUSCULAR
  Filled 2018-07-25: qty 0.5

## 2018-07-25 MED ORDER — CALCIUM CARBONATE-VITAMIN D 500-200 MG-UNIT PO TABS: 1.0000 | ORAL_TABLET | Freq: Two times a day (BID) | ORAL | 0 refills | Status: DC

## 2018-07-25 NOTE — Discharge Instructions (Signed)
Thyroidectomy, Care After °Refer to this sheet in the next few weeks. These instructions provide you with information about caring for yourself after your procedure. Your health care provider may also give you more specific instructions. Your treatment has been planned according to current medical practices, but problems sometimes occur. Call your health care provider if you have any problems or questions after your procedure. °What can I expect after the procedure? °After your procedure, it is typical to have: °· Mild pain in the neck or upper body, especially when swallowing. °· A sore throat. °· A weak voice. ° °Follow these instructions at home: °· Take medicines only as directed by your health care provider. °· If your entire thyroid gland was removed, you may need to take thyroid hormone medicine from now on. °· Do not take medicines that contain aspirin and ibuprofen until your health care provider says that you can. These medicines can increase your risk of bleeding. °· Some pain medicines cause constipation. Drink enough fluid to keep your urine clear or pale yellow. This can help to prevent constipation. °· Start slowly with eating. You may need to have only liquids and soft foods for a few days or as directed by your health care provider. °· Do not take baths, swim, or use a hot tub until your health care provider approves. °· There are many different ways to close and cover an incision, including stitches (sutures), skin glue, and adhesive strips. Follow your health care provider's instructions for: °? Incision care. °? Bandage (dressing) changes and removal. °? Incision closure removal. °· Resume your usual activities as directed by your health care provider. °· For the first 10 days after the procedure or as instructed by your health care provider: °? Do not lift anything heavier than 20 lb (9.1 kg). °? Do not jog, swim, or do other strenuous exercises. °? Do not play contact sports. °· Keep all  follow-up visits as directed by your health care provider. This is important. °Contact a health care provider if: °· The soreness in your throat gets worse. °· You have increased pain at your incision or incisions. °· You have increased bleeding from an incision. °· Your incision becomes infected. Watch for: °? Swelling. °? Redness. °? Warmth. °? Pus. °· You notice a bad smell coming from an incision or dressing. °· You have a fever. °· You feel lightheaded or faint. °· You have numbness, tingling, or muscle spasms in your: °? Arms. °? Hands. °? Feet. °? Face. °· You have trouble swallowing. °Get help right away if: °· You develop a rash. °· You have difficulty breathing. °· You hear whistling noises coming from your chest. °· You develop a cough that gets worse. °· Your speech changes, or you have hoarseness that gets worse. °This information is not intended to replace advice given to you by your health care provider. Make sure you discuss any questions you have with your health care provider. °Document Released: 05/12/2005 Document Revised: 06/25/2016 Document Reviewed: 03/25/2014 °Elsevier Interactive Patient Education © 2018 [employer] ° °

## 2018-07-25 NOTE — Progress Notes (Signed)
Removed IV-clean, dry, intact. Reviewed d/c paperwork with patient and grandfather. Wheeled stable patient and belongings to shortstay entrance where she was picked up by her grandfather.

## 2018-07-25 NOTE — Discharge Summary (Signed)
Physician Discharge Summary  Patient ID: Sherri Wiggins MRN: [id number] DOB/AGE: [birth year] 22 y.o.  Admit date: 07/24/2018 Discharge date: 07/25/2018  Admission Diagnoses: Follicular neoplasm of right thyroid lobe  Discharge Diagnoses: Same Active Problems:   Follicular neoplasm of thyroid   S/P total thyroidectomy   Discharged Condition: good  Hospital Course: Patient is a 22 year old black female who underwent a total thyroidectomy for follicular neoplasm of the right thyroid lobe on 07/24/2018.  She tolerated surgery well.  Her postoperative course has been unremarkable.  She does have mild hypercalcemia.  She is asymptomatic.  She is having no voice difficulties.  The patient is being discharged home on postoperative day 1 in good and improving condition.  Treatments: surgery: Total thyroidectomy on 07/24/2018  Discharge Exam: Blood pressure 119/74, pulse 66, temperature 98.2 F (36.8 C), temperature source Oral, resp. rate 18, height 5\' 2"  (1.575 m), weight 51 kg, SpO2 98 %. General appearance: alert, cooperative and no distress Neck: Neck incision healing well.  No significant swelling noted. Resp: clear to auscultation bilaterally Cardio: regular rate and rhythm, S1, S2 normal, no murmur, click, rub or gallop  Disposition: Discharge disposition: 01-Home or Self Care       Discharge Instructions    Diet general   Complete by:  As directed    Increase activity slowly   Complete by:  As directed      Allergies as of 07/25/2018   No Known Allergies     Medication List    TAKE these medications   acetaminophen 325 MG tablet Commonly known as:  TYLENOL Take 650 mg by mouth daily as needed for moderate pain or headache.   calcium-vitamin D 500-200 MG-UNIT tablet Commonly known as:  OSCAL WITH D Take 1 tablet by mouth 2 (two) times daily.   HYDROcodone-acetaminophen 5-325 MG tablet Commonly known as:  NORCO/VICODIN Take 1 tablet by mouth every 4 (four) hours  as needed for moderate pain.   levothyroxine 100 MCG tablet Commonly known as:  SYNTHROID, LEVOTHROID Take 1.5 tablets (150 mcg total) by mouth daily before breakfast.   Norethin Ace-Eth Estrad-FE 1-20 MG-MCG(24) Caps Take 1 tablet by mouth daily.      Follow-up Information    Aviva Signs, MD. Schedule an appointment as soon as possible for a visit on 08/01/2018.   Specialty:  General Surgery Contact information: [id number] [id number] [phone]           Signed: Aviva Signs 07/25/2018, 8:53 AM

## 2018-08-01 ENCOUNTER — Other Ambulatory Visit: Payer: Self-pay | Admitting: General Surgery

## 2018-08-01 MED ORDER — HYDROCODONE-ACETAMINOPHEN 5-325 MG PO TABS: 1.0000 | ORAL_TABLET | ORAL | 0 refills | Status: DC | PRN

## 2018-08-06 ENCOUNTER — Encounter: Payer: Self-pay | Admitting: General Surgery

## 2018-08-06 ENCOUNTER — Ambulatory Visit (INDEPENDENT_AMBULATORY_CARE_PROVIDER_SITE_OTHER): Payer: Self-pay | Admitting: General Surgery

## 2018-08-06 VITALS — BP 110/67 | HR 63 | Temp 97.7°F | Resp 16 | Wt 112.0 lb

## 2018-08-06 DIAGNOSIS — Z09 Encounter for follow-up examination after completed treatment for conditions other than malignant neoplasm: Secondary | ICD-10-CM

## 2018-08-06 NOTE — Progress Notes (Signed)
Subjective:     Sherri Wiggins  Status post total thyroidectomy.  Doing well.  Has no complaints.  Her voice has returned to normal. Objective:    BP 110/67 (BP Location: Left Arm, Patient Position: Sitting, Cuff Size: Normal)   Pulse 63   Temp 97.7 F (36.5 C) (Temporal)   Resp 16   Wt 112 lb (50.8 kg)   BMI 20.49 kg/m   General:  alert, cooperative and no distress  Neck is healing well.  No hematoma present. 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  Family is aware.     Assessment:    Doing well postoperatively.    Plan:   Patient already has follow-up appointment with Dr. Dorris Fetch for further evaluation and treatment.  Follow-up here as needed.

## 2018-08-15 ENCOUNTER — Ambulatory Visit: Payer: Medicaid Other | Admitting: "Endocrinology

## 2018-08-20 ENCOUNTER — Other Ambulatory Visit (HOSPITAL_COMMUNITY)
Admission: RE | Admit: 2018-08-20 | Discharge: 2018-08-20 | Disposition: A | Discharge status: Home / Self Care | Payer: Medicaid Other | Source: Ambulatory Visit | Attending: "Endocrinology | Admitting: "Endocrinology

## 2018-08-20 DIAGNOSIS — R899 Unspecified abnormal finding in specimens from other organs, systems and tissues: Secondary | ICD-10-CM | POA: Insufficient documentation

## 2018-08-21 ENCOUNTER — Other Ambulatory Visit (HOSPITAL_COMMUNITY)
Admission: RE | Admit: 2018-08-21 | Discharge: 2018-08-21 | Disposition: A | Discharge status: Home / Self Care | Payer: Medicaid Other | Source: Ambulatory Visit | Attending: "Endocrinology | Admitting: "Endocrinology

## 2018-08-21 DIAGNOSIS — R899 Unspecified abnormal finding in specimens from other organs, systems and tissues: Secondary | ICD-10-CM | POA: Diagnosis not present

## 2018-08-21 LAB — T4, FREE: Free T4: 2.09 ng/dL — ABNORMAL HIGH (ref 0.82–1.77)

## 2018-08-21 LAB — COMPREHENSIVE METABOLIC PANEL
ALBUMIN: 4.3 g/dL (ref 3.5–5.0)
ALK PHOS: 44 U/L (ref 38–126)
ALT: 7 U/L (ref 0–44)
AST: 14 U/L — ABNORMAL LOW (ref 15–41)
Anion gap: 7 (ref 5–15)
BUN: 11 mg/dL (ref 6–20)
CALCIUM: 9.4 mg/dL (ref 8.9–10.3)
CO2: 25 mmol/L (ref 22–32)
CREATININE: 0.76 mg/dL (ref 0.44–1.00)
Chloride: 105 mmol/L (ref 98–111)
GFR calc Af Amer: >60 mL/min (ref >60)
GFR calc non Af Amer: >60 mL/min (ref >60)
GLUCOSE: 86 mg/dL (ref 70–99)
Potassium: 4.1 mmol/L (ref 3.5–5.1)
SODIUM: 137 mmol/L (ref 135–145)
TOTAL PROTEIN: 8.2 g/dL — AB (ref 6.5–8.1)
Total Bilirubin: 1.5 mg/dL — ABNORMAL HIGH (ref 0.3–1.2)

## 2018-08-21 LAB — TSH: TSH: 0.033 u[IU]/mL — ABNORMAL LOW (ref 0.350–4.500)

## 2018-09-05 ENCOUNTER — Ambulatory Visit (INDEPENDENT_AMBULATORY_CARE_PROVIDER_SITE_OTHER): Payer: Medicaid Other | Admitting: "Endocrinology

## 2018-09-05 ENCOUNTER — Encounter: Payer: Self-pay | Admitting: "Endocrinology

## 2018-09-05 VITALS — BP 120/70 | HR 88 | Ht 64.0 in | Wt 111.0 lb

## 2018-09-05 DIAGNOSIS — C73 Malignant neoplasm of thyroid gland: Secondary | ICD-10-CM

## 2018-09-05 DIAGNOSIS — E890 Postprocedural hypothyroidism: Secondary | ICD-10-CM | POA: Diagnosis not present

## 2018-09-05 MED ORDER — LEVOTHYROXINE SODIUM 100 MCG PO TABS: 100.0000 ug | ORAL_TABLET | Freq: Every day | ORAL | 6 refills | Status: DC

## 2018-09-05 NOTE — Progress Notes (Signed)
Endocrinology follow-up note                                            09/05/2018, 4:22 PM   Subjective:    Patient ID: Sherri Wiggins, female    DOB: 11/08/1995, PCP Sherri Squibb, MD   Past Medical History:  Diagnosis Date  . Asthma    as child  . 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 Retinopathy   . Seizures ([place])    One after oral surgery 10 years ago. On no meds, no more seizures  . Thyroid disease    enlarged   Past Surgical History:  Procedure Laterality Date  . BIOPSY THYROID     x 2  . CATARACT EXTRACTION W/PHACO Bilateral 04/30/2017   Procedure: CATARACT EXTRACTION PHACO AND INTRAOCULAR LENS PLACEMENT (IOC);  Surgeon: Sherri Branch, MD;  Location: [facility];  Service: Ophthalmology;  Laterality: Bilateral;  LEFT EYE CDE: 0.46 RIGHT EYE CDE: 0.00  . EYE SURGERY     laser  . HERNIA REPAIR Right   . RHINOPLASTY FOR CLEFT LIP / PALATE    . STRABISMUS SURGERY    . THYROIDECTOMY N/A 07/24/2018   Procedure: TOTAL THYROIDECTOMY;  Surgeon: Sherri Signs, MD;  Location: [facility];  Service: General;  Laterality: N/A;  . TYMPANOSTOMY TUBE PLACEMENT     Social History   Socioeconomic History  . Marital status: Single    Spouse name: Not on file  . Number of children: Not on file  . Years of education: Not on file  . Highest education level: Not on file  Occupational History  . Not on file  Social Needs  . Financial resource strain: Not on file  . Food insecurity:    Worry: Not on file    Inability: Not on file  . Transportation needs:    Medical: Not on file    Non-medical: Not on file  Tobacco Use  . Smoking status: Never Smoker  . Smokeless tobacco: Never Used  Substance and Sexual Activity  . Alcohol use: Not Currently  . Drug use: No  . Sexual activity: Yes    Birth control/protection: Condom, Pill  Lifestyle  . Physical activity:    Days per week: Not on file    Minutes per session: Not on file  . Stress: Not on file   Relationships  . Social connections:    Talks on phone: Not on file    Gets together: Not on file    Attends religious service: Not on file    Active member of club or organization: Not on file    Attends meetings of clubs or organizations: Not on file    Relationship status: Not on file  Other Topics Concern  . Not on file  Social History Narrative  . Not on file   Outpatient Encounter Medications as of 09/05/2018  Medication Sig  . Melatonin 10 MG TABS Take by mouth at bedtime.  Marland Kitchen acetaminophen (TYLENOL) 325 MG tablet Take 650 mg by mouth daily as needed for moderate pain or headache.  . calcium-vitamin D (OSCAL 500/200 D-3) 500-200 MG-UNIT tablet Take 1 tablet by mouth 2 (two) times daily.  Marland Kitchen levothyroxine (SYNTHROID, LEVOTHROID) 100 MCG tablet Take 1 tablet (100 mcg total) by mouth daily before breakfast.  . Norethin  Ace-Eth Estrad-FE (TAYTULLA) 1-20 MG-MCG(24) CAPS Take 1 tablet by mouth daily.  . [DISCONTINUED] HYDROcodone-acetaminophen (NORCO) 5-325 MG tablet Take 1 tablet by mouth every 4 (four) hours as needed for moderate pain.  . [DISCONTINUED] levothyroxine (SYNTHROID, LEVOTHROID) 100 MCG tablet Take 1.5 tablets (150 mcg total) by mouth daily before breakfast.   No facility-administered encounter medications on file as of 09/05/2018.    ALLERGIES: No Known Allergies  VACCINATION STATUS: Immunization History  Administered Date(s) Administered  . Influenza,inj,Quad PF,6+ Mos 07/25/2018    HPI Sherri Wiggins is 22 y.o. female who presents today with a medical history as above. she is that is post near total thyroidectomy following abnormal FNA and Afirma molecular studies of asprate.  She has suboptimal intelligence,  a poor historian, accompanied by her grandparents today.  -She has been very well following her surgery.  She is on levothyroxine 150 mcg p.o. every morning with lab evidence of overtreatment causing clinical insomnia and unintended weight loss. -She  is also on calcium supplements twice a day. 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  -She denies dysphagia, odynophagia, voice change.  She is status post corrective surgery for cleft lip and palate in 1999.    -Her grandmother reveals that 1 of the patient great aunt was diagnosed with thyroid cancer.   Review of Systems  Constitutional: + Steady weight, no fatigue, + subjective hyperthermia, no subjective hypothermia Eyes: no blurry vision, no xerophthalmia ENT: no sore throat, + thyroidectomy , no dysphagia/odynophagia, no hoarseness Cardiovascular: no Chest Pain, no Shortness of Breath, no palpitations, no leg swelling Respiratory: no cough, no SOB Gastrointestinal: no Nausea/Vomiting/Diarhhea Musculoskeletal: no muscle/joint aches Skin: no rashes Neurological: no tremors, no numbness, no tingling, no dizziness Psychiatric: no depression, no anxiety  Objective:    BP 120/70   Pulse 88   Ht _0  (1.626 m)   Wt 111 lb (50.3 kg)   BMI 19.05 kg/m   Wt Readings from Last 3 Encounters:  09/05/18 111 lb (50.3 kg)  08/06/18 112 lb (50.8 kg)  07/24/18 112 lb 7 oz (51 kg)    Physical Exam  Constitutional: + Light build for her height,  not in acute distress, normal state of mind Eyes: PERRLA, EOMI, no exophthalmos ENT: + Scars from her prior surgeries for cleft lip and palate repair.  Moist mucous membranes, + thyroidectomy scar on anterior lower neck, no cervical lymphadenopathy Cardiovascular: normal precordial activity, Regular Rate and Rhythm, no Murmur/Rubs/Gallops Respiratory:  adequate breathing efforts, no gross chest deformity, Clear to auscultation bilaterally Gastrointestinal: abdomen soft, Non -tender, No distension, Bowel Sounds present Musculoskeletal: no gross deformities, strength intact in all four extremities Skin: moist, warm, no rashes Neurological:  no tremor with outstretched hands   CMP ( most recent) CMP     Component Value Date/Time   NA 137 08/21/2018 1536   K 4.1 08/21/2018 1536   CL 105 08/21/2018 1536   CO2 25 08/21/2018 1536   GLUCOSE 86 08/21/2018 1536   BUN 11 08/21/2018 1536   CREATININE 0.76 08/21/2018 1536   CALCIUM 9.4 08/21/2018 1536   PROT 8.2 (H) 08/21/2018 1536   ALBUMIN 4.3 08/21/2018 1536   AST 14 (L) 08/21/2018 1536   ALT 7 08/21/2018 1536   ALKPHOS 44 08/21/2018 1536   BILITOT 1.5 (H) 08/21/2018 1536   GFRNONAA >60 08/21/2018 1536   GFRAA >60  08/21/2018 1536   No recent thyroid function test to review. Ultrasound November 29, 2017: Right lobe measured 4.9 cm, left lobe measured 4.4 submitted.  The nodule on the right lobe measures 3.9 cm x 3.100 x 1.9 cm. Fine-needle aspiration on February 06, 2018 revealed atypia of undetermined significance or follicular lesion of undetermined significance (Bethesda category III) Molecular study on her aspirate using  Afirma labs reveals suspicious mass.  With risk of malignancy approximately 50%.  Complete report scanned into her records.  Recent Results (from the past 2160 hour(s))  hCG, serum, qualitative (Not at [facility])     Status: None   Collection Time: 07/18/18  3:19 PM  Result Value Ref Range   Preg, Serum NEGATIVE NEGATIVE    Comment:        THE SENSITIVITY OF THIS METHODOLOGY IS >10 mIU/mL. Performed at [facility], [address] [place], [place] [address]   CBC WITH DIFFERENTIAL     Status: None   Collection Time: 07/18/18  3:19 PM  Result Value Ref Range   WBC 8.0 4.0 - 10.5 K/uL   RBC 4.67 3.87 - 5.11 MIL/uL   Hemoglobin 13.9 12.0 - 15.0 g/dL   HCT 40.9 36.0 - 46.0 %   MCV 87.6 78.0 - 100.0 fL   MCH 29.8 26.0 - 34.0 pg   MCHC 34.0 30.0 - 36.0 g/dL   RDW 12.0 11.5 - 15.5 %   Platelets 330 150 - 400 K/uL   Neutrophils Relative % 56 %   Neutro Abs 4.5 1.7 - 7.7 K/uL   Lymphocytes Relative 32 %   Lymphs Abs 2.6 0.7 - 4.0 K/uL   Monocytes  Relative 7 %   Monocytes Absolute 0.6 0.1 - 1.0 K/uL   Eosinophils Relative 4 %   Eosinophils Absolute 0.3 0.0 - 0.7 K/uL   Basophils Relative 1 %   Basophils Absolute 0.1 0.0 - 0.1 K/uL    Comment: Performed at [facility], [address], [place], [place] [address]  Comprehensive metabolic panel     Status: Abnormal   Collection Time: 07/18/18  3:19 PM  Result Value Ref Range   Sodium 138 135 - 145 mmol/L   Potassium 3.8 3.5 - 5.1 mmol/L   Chloride 106 98 - 111 mmol/L   CO2 25 22 - 32 mmol/L   Glucose, Bld 108 (H) 70 - 99 mg/dL   BUN 11 6 - 20 mg/dL   Creatinine, Ser 0.81 0.44 - 1.00 mg/dL   Calcium 8.8 (L) 8.9 - 10.3 mg/dL   Total Protein 7.6 6.5 - 8.1 g/dL   Albumin 3.9 3.5 - 5.0 g/dL   AST 16 15 - 41 U/L   ALT 6 0 - 44 U/L   Alkaline Phosphatase 44 38 - 126 U/L   Total Bilirubin 1.0 0.3 - 1.2 mg/dL   GFR calc non Af Amer >60 >60 mL/min   GFR calc Af Amer >60 >60 mL/min    Comment: (NOTE) The eGFR has been calculated using the CKD EPI equation. This calculation has not been validated in all clinical situations. eGFR's persistently <60 mL/min signify possible Chronic Kidney Disease.    Anion gap 7 5 - 15    Comment: Performed at [facility], [address] [place], [place] [address]  Comprehensive metabolic panel     Status: Abnormal   Collection Time: 07/24/18  4:05 PM  Result Value Ref Range   Sodium 137 135 - 145 mmol/L   Potassium 3.9 3.5 -  5.1 mmol/L   Chloride 106 98 - 111 mmol/L   CO2 24 22 - 32 mmol/L   Glucose, Bld 91 70 - 99 mg/dL   BUN 12 6 - 20 mg/dL   Creatinine, Ser 0.87 0.44 - 1.00 mg/dL   Calcium 8.3 (L) 8.9 - 10.3 mg/dL   Total Protein 6.9 6.5 - 8.1 g/dL   Albumin 3.6 3.5 - 5.0 g/dL   AST 15 15 - 41 U/L   ALT 8 0 - 44 U/L   Alkaline Phosphatase 38 38 - 126 U/L   Total Bilirubin 2.4 (H) 0.3 - 1.2 mg/dL   GFR calc non Af Amer >60 >60 mL/min   GFR calc Af Amer >60 >60 mL/min    Comment: (NOTE) The eGFR has been calculated using the CKD EPI  equation. This calculation has not been validated in all clinical situations. eGFR's persistently <60 mL/min signify possible Chronic Kidney Disease.    Anion gap 7 5 - 15    Comment: Performed at [facility], [address] [place], [place] [address]  Comprehensive metabolic panel     Status: Abnormal   Collection Time: 07/25/18  5:07 AM  Result Value Ref Range   Sodium 139 135 - 145 mmol/L   Potassium 3.6 3.5 - 5.1 mmol/L   Chloride 107 98 - 111 mmol/L   CO2 23 22 - 32 mmol/L   Glucose, Bld 85 70 - 99 mg/dL   BUN 13 6 - 20 mg/dL   Creatinine, Ser 0.88 0.44 - 1.00 mg/dL   Calcium 8.0 (L) 8.9 - 10.3 mg/dL   Total Protein 6.4 (L) 6.5 - 8.1 g/dL   Albumin 3.3 (L) 3.5 - 5.0 g/dL   AST 13 (L) 15 - 41 U/L   ALT 6 0 - 44 U/L   Alkaline Phosphatase 36 (L) 38 - 126 U/L   Total Bilirubin 2.5 (H) 0.3 - 1.2 mg/dL   GFR calc non Af Amer >60 >60 mL/min   GFR calc Af Amer >60 >60 mL/min    Comment: (NOTE) The eGFR has been calculated using the CKD EPI equation. This calculation has not been validated in all clinical situations. eGFR's persistently <60 mL/min signify possible Chronic Kidney Disease.    Anion gap 9 5 - 15    Comment: Performed at [facility], [address] [place], [place] [address]  CBC     Status: None   Collection Time: 07/25/18  5:07 AM  Result Value Ref Range   WBC 8.9 4.0 - 10.5 K/uL   RBC 4.25 3.87 - 5.11 MIL/uL   Hemoglobin 12.7 12.0 - 15.0 g/dL   HCT 37.9 36.0 - 46.0 %   MCV 89.2 78.0 - 100.0 fL   MCH 29.9 26.0 - 34.0 pg   MCHC 33.5 30.0 - 36.0 g/dL   RDW 12.0 11.5 - 15.5 %   Platelets 294 150 - 400 K/uL    Comment: Performed at [facility], [address], [place], [place] [address]  TSH     Status: Abnormal   Collection Time: 08/21/18  3:36 PM  Result Value Ref Range   TSH 0.033 (L) 0.350 - 4.500 uIU/mL    Comment: Performed by a 3rd Generation assay with a functional sensitivity of <=0.01 uIU/mL. Performed at [facility], [address],  [place], [place] [address]   T4, free     Status: Abnormal   Collection Time: 08/21/18  3:36 PM  Result Value Ref Range   Free T4  2.09 (H) 0.82 - 1.77 ng/dL    Comment: (NOTE) Biotin ingestion may interfere with free T4 tests. If the results are inconsistent with the TSH level, previous test results, or the clinical presentation, then consider biotin interference. If needed, order repeat testing after stopping biotin. Performed at [facility], [place] [address], [place], [place] [address]   Comprehensive metabolic panel     Status: Abnormal   Collection Time: 08/21/18  3:36 PM  Result Value Ref Range   Sodium 137 135 - 145 mmol/L   Potassium 4.1 3.5 - 5.1 mmol/L   Chloride 105 98 - 111 mmol/L   CO2 25 22 - 32 mmol/L   Glucose, Bld 86 70 - 99 mg/dL   BUN 11 6 - 20 mg/dL   Creatinine, Ser 0.76 0.44 - 1.00 mg/dL   Calcium 9.4 8.9 - 10.3 mg/dL   Total Protein 8.2 (H) 6.5 - 8.1 g/dL   Albumin 4.3 3.5 - 5.0 g/dL   AST 14 (L) 15 - 41 U/L   ALT 7 0 - 44 U/L   Alkaline Phosphatase 44 38 - 126 U/L   Total Bilirubin 1.5 (H) 0.3 - 1.2 mg/dL   GFR calc non Af Amer >60 >60 mL/min   GFR calc Af Amer >60 >60 mL/min    Comment: (NOTE) The eGFR has been calculated using the CKD EPI equation. This calculation has not been validated in all clinical situations. eGFR's persistently <60 mL/min signify possible Chronic Kidney Disease.    Anion gap 7 5 - 15    Comment: Performed at [facility], [address] [place], [place] [address]   Results for ZARA, WENDT (MRN [id number]) as of 09/05/2018 15:38  Ref. Range 08/21/2018 15:36  TSH Latest Ref Range: 0.350 - 4.500 uIU/mL 0.033 (L)  T4,Free(Direct) Latest Ref Range: 0.82 - 1.77 ng/dL 2.09 (H)    Assessment & Plan:   1.  Follicular variant papillary thyroid cancer- pT3a, pNx  -Surgical findings, completion of treatment and long-term surveillance have been discussed with both of her grandparents. -She is status post near total  thyroidectomy.  She will require adjuvant therapy with I-131 thyroid remnant ablation stimulated by Thyrogen injection.  This will be followed by whole body scan.     She will need at least one form of thyroid/neck imaging yearly for the next 5 years for long-term tumor surveillance.   2.  Postsurgical hypothyroidism -Her thyroid function tests are consistent with over replacement.  I discussed and lowered her levothyroxine to 100 mcg p.o. daily before breakfast.    - We discussed about correct intake of levothyroxine, at fasting, with water, separated by at least 30 minutes from breakfast, and separated by more than 4 hours from calcium, iron, multivitamins, acid reflux medications (PPIs). -Patient and her grandparents are  made aware of the fact that thyroid hormone replacement is needed for life, dose to be adjusted by periodic monitoring of thyroid function tests.  -Her previsit labs show calcium of 9.4.  I advised her to lower her calcium supplements to 1 capsule daily.  She will likely come off of this supplement during her next visit.   - Time spent with the patient and her grandparents: 25 min, of which >50% was spent in reviewing her surgical pathology, discussion of various types of thyroid cancer ,  current and  previous labs, previous treatments, and medications doses and developing a plan for long-term tumor surveillance /care.  Sherri Wiggins and her grandparents  participated in the discussions, expressed understanding, and voiced agreement with the above plans. They are  encouraged to contact clinic should  have any questions or concerns prior to her return visit.   Follow up plan: Return in about 9 weeks (around 11/07/2018) for Follow up with Pre-visit Labs.   Glade Lloyd, MD [facility] [address] [place], [place] [address] Phone: [phone]  Fax: [phone]     09/05/2018, 4:22 PM  This note was partially  dictated with voice recognition software. Similar sounding words can be transcribed inadequately or may not  be corrected upon review.

## 2018-09-06 ENCOUNTER — Other Ambulatory Visit: Payer: Self-pay | Admitting: "Endocrinology

## 2018-09-06 DIAGNOSIS — Z01812 Encounter for preprocedural laboratory examination: Secondary | ICD-10-CM

## 2018-09-18 ENCOUNTER — Encounter (HOSPITAL_COMMUNITY): Payer: Self-pay

## 2018-09-18 ENCOUNTER — Encounter (HOSPITAL_COMMUNITY)
Admission: RE | Admit: 2018-09-18 | Discharge: 2018-09-18 | Disposition: A | Discharge status: Home / Self Care | Payer: Medicaid Other | Source: Ambulatory Visit | Attending: "Endocrinology | Admitting: "Endocrinology

## 2018-09-18 DIAGNOSIS — C73 Malignant neoplasm of thyroid gland: Secondary | ICD-10-CM | POA: Insufficient documentation

## 2018-09-18 MED ORDER — THYROTROPIN ALFA 1.1 MG IM SOLR
0.9000 mg | INTRAMUSCULAR | Status: AC
  Administered 2018-09-18: 0.9 mg via INTRAMUSCULAR

## 2018-09-18 MED ORDER — THYROTROPIN ALFA 1.1 MG IM SOLR
INTRAMUSCULAR | Status: AC
  Administered 2018-09-18: 0.9 mg via INTRAMUSCULAR
  Filled 2018-09-18: qty 0.9

## 2018-09-18 MED ORDER — STERILE WATER FOR INJECTION IJ SOLN
INTRAMUSCULAR | Status: AC
  Administered 2018-09-18: 1.1 mL
  Filled 2018-09-18: qty 10

## 2018-09-19 ENCOUNTER — Encounter (HOSPITAL_COMMUNITY)
Admission: RE | Admit: 2018-09-19 | Discharge: 2018-09-19 | Disposition: A | Discharge status: Home / Self Care | Payer: Medicaid Other | Source: Ambulatory Visit | Attending: "Endocrinology | Admitting: "Endocrinology

## 2018-09-19 DIAGNOSIS — C73 Malignant neoplasm of thyroid gland: Secondary | ICD-10-CM

## 2018-09-19 MED ORDER — STERILE WATER FOR INJECTION IJ SOLN
INTRAMUSCULAR | Status: AC
  Administered 2018-09-19: 1 mL
  Filled 2018-09-19: qty 10

## 2018-09-19 MED ORDER — THYROTROPIN ALFA 1.1 MG IM SOLR
0.9000 mg | INTRAMUSCULAR | Status: AC
  Administered 2018-09-19: 0.9 mg via INTRAMUSCULAR

## 2018-09-19 MED ORDER — THYROTROPIN ALFA 1.1 MG IM SOLR
INTRAMUSCULAR | Status: AC
  Administered 2018-09-19: 0.9 mg via INTRAMUSCULAR
  Filled 2018-09-19: qty 0.9

## 2018-09-20 ENCOUNTER — Encounter (HOSPITAL_COMMUNITY): Payer: Self-pay

## 2018-09-20 ENCOUNTER — Encounter (HOSPITAL_COMMUNITY)
Admission: RE | Admit: 2018-09-20 | Discharge: 2018-09-20 | Disposition: A | Discharge status: Home / Self Care | Payer: Medicaid Other | Source: Ambulatory Visit | Attending: "Endocrinology | Admitting: "Endocrinology

## 2018-09-20 ENCOUNTER — Other Ambulatory Visit (HOSPITAL_COMMUNITY)
Admission: RE | Admit: 2018-09-20 | Discharge: 2018-09-20 | Disposition: A | Discharge status: Home / Self Care | Payer: Medicaid Other | Source: Ambulatory Visit | Attending: "Endocrinology | Admitting: "Endocrinology

## 2018-09-20 DIAGNOSIS — E890 Postprocedural hypothyroidism: Secondary | ICD-10-CM | POA: Diagnosis present

## 2018-09-20 DIAGNOSIS — C73 Malignant neoplasm of thyroid gland: Secondary | ICD-10-CM | POA: Diagnosis not present

## 2018-09-20 LAB — COMPREHENSIVE METABOLIC PANEL
ALT: 8 U/L (ref 0–44)
ANION GAP: 11 (ref 5–15)
AST: 16 U/L (ref 15–41)
Albumin: 4.3 g/dL (ref 3.5–5.0)
Alkaline Phosphatase: 39 U/L (ref 38–126)
BILIRUBIN TOTAL: 2.1 mg/dL — AB (ref 0.3–1.2)
BUN: 10 mg/dL (ref 6–20)
CHLORIDE: 106 mmol/L (ref 98–111)
CO2: 23 mmol/L (ref 22–32)
Calcium: 9.3 mg/dL (ref 8.9–10.3)
Creatinine, Ser: 0.88 mg/dL (ref 0.44–1.00)
Glucose, Bld: 83 mg/dL (ref 70–99)
POTASSIUM: 3.9 mmol/L (ref 3.5–5.1)
Sodium: 140 mmol/L (ref 135–145)
Total Protein: 8.2 g/dL — ABNORMAL HIGH (ref 6.5–8.1)

## 2018-09-20 LAB — TSH: TSH: 223.19 u[IU]/mL — AB (ref 0.350–4.500)

## 2018-09-20 LAB — PREGNANCY, URINE: Preg Test, Ur: NEGATIVE

## 2018-09-20 LAB — T4, FREE: FREE T4: 1.37 ng/dL (ref 0.82–1.77)

## 2018-09-20 MED ORDER — SODIUM IODIDE I 131 CAPSULE
100.0000 | Freq: Once | INTRAVENOUS | Status: AC | PRN
  Administered 2018-09-20: 102.3 via ORAL

## 2018-09-21 LAB — THYROGLOBULIN ANTIBODY

## 2018-09-25 LAB — THYROGLOBULIN LEVEL: Thyroglobulin: 9.8 ng/mL

## 2018-09-30 ENCOUNTER — Encounter (HOSPITAL_COMMUNITY): Payer: Self-pay

## 2018-09-30 ENCOUNTER — Encounter (HOSPITAL_COMMUNITY)
Admission: RE | Admit: 2018-09-30 | Discharge: 2018-09-30 | Disposition: A | Discharge status: Home / Self Care | Payer: Medicaid Other | Source: Ambulatory Visit | Attending: "Endocrinology | Admitting: "Endocrinology

## 2018-09-30 DIAGNOSIS — C73 Malignant neoplasm of thyroid gland: Secondary | ICD-10-CM

## 2018-09-30 MED ORDER — SODIUM CHLORIDE 0.9% FLUSH
INTRAVENOUS | Status: AC
  Filled 2018-09-30: qty 150

## 2018-10-09 ENCOUNTER — Telehealth: Payer: Self-pay | Admitting: "Endocrinology

## 2018-10-09 NOTE — Telephone Encounter (Signed)
Mardene Celeste is calling on behalf of Sherri Wiggins asking for Thyroid scan results, please advise?

## 2018-10-09 NOTE — Telephone Encounter (Signed)
She needs to keep appointment to discuss the results and the whole plan for future care.

## 2018-10-14 NOTE — Telephone Encounter (Signed)
Sherri Wiggins is aware of the recommendation

## 2018-11-07 ENCOUNTER — Encounter: Payer: Self-pay | Admitting: "Endocrinology

## 2018-11-07 ENCOUNTER — Ambulatory Visit (INDEPENDENT_AMBULATORY_CARE_PROVIDER_SITE_OTHER): Payer: Medicaid Other | Admitting: "Endocrinology

## 2018-11-07 VITALS — BP 124/75 | HR 67 | Ht 64.0 in | Wt 112.0 lb

## 2018-11-07 DIAGNOSIS — E890 Postprocedural hypothyroidism: Secondary | ICD-10-CM | POA: Diagnosis not present

## 2018-11-07 DIAGNOSIS — C73 Malignant neoplasm of thyroid gland: Secondary | ICD-10-CM | POA: Diagnosis not present

## 2018-11-07 NOTE — Progress Notes (Signed)
Endocrinology follow-up note                                            11/07/2018, 4:32 PM   Subjective:    Patient ID: Sherri Wiggins, female    DOB: [birth year], PCP Celene Squibb, MD   Past Medical History:  Diagnosis Date  . Asthma    as child  . 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 Retinopathy   . Seizures ([place])    One after oral surgery 10 years ago. On no meds, no more seizures  . Thyroid disease    enlarged   Past Surgical History:  Procedure Laterality Date  . BIOPSY THYROID     x 2  . CATARACT EXTRACTION W/PHACO Bilateral 04/30/2017   Procedure: CATARACT EXTRACTION PHACO AND INTRAOCULAR LENS PLACEMENT (IOC);  Surgeon: Tonny Branch, MD;  Location: [facility];  Service: Ophthalmology;  Laterality: Bilateral;  LEFT EYE CDE: 0.46 RIGHT EYE CDE: 0.00  . EYE SURGERY     laser  . HERNIA REPAIR Right   . RHINOPLASTY FOR CLEFT LIP / PALATE    . STRABISMUS SURGERY    . THYROIDECTOMY N/A 07/24/2018   Procedure: TOTAL THYROIDECTOMY;  Surgeon: Aviva Signs, MD;  Location: [facility];  Service: General;  Laterality: N/A;  . TYMPANOSTOMY TUBE PLACEMENT     Social History   Socioeconomic History  . Marital status: Single    Spouse name: Not on file  . Number of children: Not on file  . Years of education: Not on file  . Highest education level: Not on file  Occupational History  . Not on file  Social Needs  . Financial resource strain: Not on file  . Food insecurity:    Worry: Not on file    Inability: Not on file  . Transportation needs:    Medical: Not on file    Non-medical: Not on file  Tobacco Use  . Smoking status: Never Smoker  . Smokeless tobacco: Never Used  Substance and Sexual Activity  . Alcohol use: Not Currently  . Drug use: No  . Sexual activity: Yes    Birth control/protection: Condom, Pill  Lifestyle  . Physical activity:    Days per week: Not on file    Minutes per session: Not on file  . Stress: Not on file   Relationships  . Social connections:    Talks on phone: Not on file    Gets together: Not on file    Attends religious service: Not on file    Active member of club or organization: Not on file    Attends meetings of clubs or organizations: Not on file    Relationship status: Not on file  Other Topics Concern  . Not on file  Social History Narrative  . Not on file   Outpatient Encounter Medications as of 11/07/2018  Medication Sig  . acetaminophen (TYLENOL) 325 MG tablet Take 650 mg by mouth daily as needed for moderate pain or headache.  . levothyroxine (SYNTHROID, LEVOTHROID) 100 MCG tablet Take 1 tablet (100 mcg total) by mouth daily before breakfast.  . Melatonin 10 MG TABS Take by mouth at bedtime.  . Norethin Ace-Eth Estrad-FE (TAYTULLA) 1-20 MG-MCG(24) CAPS Take 1 tablet by mouth daily.  . [DISCONTINUED] calcium-vitamin D (OSCAL 500/200  D-3) 500-200 MG-UNIT tablet Take 1 tablet by mouth 2 (two) times daily. (Patient not taking: Reported on 11/07/2018)   No facility-administered encounter medications on file as of 11/07/2018.    ALLERGIES: No Known Allergies  VACCINATION STATUS: Immunization History  Administered Date(s) Administered  . Influenza,inj,Quad PF,6+ Mos 07/25/2018    HPI Sherri Wiggins is 23 y.o. female who presents today with a medical history as above. she is  post near total thyroidectomy following abnormal FNA and Afirma molecular studies of asprate.  She has suboptimal intelligence,  a poor historian, accompanied by her grandparents today.  -She has recovered very well from her surgery.  She is currently on levothyroxine 100 mcg p.o. daily before breakfast.   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  -Her post I-131 therapy whole body scan shows mild activity in the neck consistent with thyroid remnant, absent evidence of distant  metastasis.  -She denies dysphagia, odynophagia, voice change.  She is status post corrective surgery for cleft lip and palate in 1999.    -Her grandmother reveals that 1 of the patient great aunt was diagnosed with thyroid cancer.   Review of Systems  Constitutional: + Steady weight, no fatigue, - subjective hyperthermia/ no subjective hypothermia Eyes: no blurry vision, no xerophthalmia ENT: no sore throat, + thyroidectomy, no dysphagia/odynophagia, no hoarseness Cardiovascular: no Chest Pain, no Shortness of Breath, no palpitations, no leg swelling  Skin: no rashes Neurological: no tremors, no numbness, no tingling, no dizziness Psychiatric: no depression, no anxiety  Objective:    BP 124/75   Pulse 67   Ht 5\' 4"  (1.626 m)   Wt 112 lb (50.8 kg)   BMI 19.22 kg/m   Wt Readings from Last 3 Encounters:  11/07/18 112 lb (50.8 kg)  09/05/18 111 lb (50.3 kg)  08/06/18 112 lb (50.8 kg)    Physical Exam  Constitutional: + Light build for her height,  not in acute distress, normal state of mind Eyes: PERRLA, EOMI, no exophthalmos ENT: + Scars from her prior surgeries for cleft lip and palate repair.  Moist mucous membranes, + thyroidectomy scar on anterior lower neck, 1 cm left cervical lymphadenopathy  Cardiovascular: normal precordial activity, Regular Rate and Rhythm, no Murmur/Rubs/Gallops Respiratory:  adequate breathing efforts, no gross chest deformity, Clear to auscultation bilaterally Gastrointestinal: abdomen soft, Non -tender, No distension, Bowel Sounds present Musculoskeletal: no gross deformities, strength intact in all four extremities Skin: moist, warm, no rashes Neurological: no tremor with outstretched hands   CMP ( most recent) CMP     Component Value Date/Time   NA 140 09/20/2018 0854   K 3.9 09/20/2018 0854   CL 106 09/20/2018 0854   CO2 23 09/20/2018 0854   GLUCOSE 83 09/20/2018 0854   BUN 10 09/20/2018 0854   CREATININE 0.88 09/20/2018 0854    CALCIUM 9.3 09/20/2018 0854   PROT 8.2 (H) 09/20/2018 0854   ALBUMIN 4.3 09/20/2018 0854   AST 16 09/20/2018 0854   ALT 8 09/20/2018 0854   ALKPHOS 39 09/20/2018 0854   BILITOT 2.1 (H) 09/20/2018 0854   GFRNONAA >60 09/20/2018 0854   GFRAA >60 09/20/2018 0854   No recent thyroid function test to review. Ultrasound November 29, 2017: Right lobe measured 4.9 cm, left lobe measured 4.4 submitted.  The nodule on the right lobe measures 3.9 cm x 3.100 x 1.9 cm. Fine-needle aspiration  on February 06, 2018 revealed atypia of undetermined significance or follicular lesion of undetermined significance (Bethesda category III) Molecular study on her aspirate using  Afirma labs reveals suspicious mass.  With risk of malignancy approximately 50%.  Complete report scanned into her records.   Results for Sherri Wiggins, Sherri Wiggins (MRN [id number]) as of 11/07/2018 15:33  Ref. Range 08/21/2018 15:36 09/20/2018 08:54 09/20/2018 08:55 09/20/2018 09:05  TSH Latest Ref Range: 0.350 - 4.500 uIU/mL 0.033 (L) 223.190 (H)    T4,Free(Direct) Latest Ref Range: 0.82 - 1.77 ng/dL 2.09 (H)  1.37   Thyroglobulin Latest Units: ng/mL  9.8    Thyroglobulin Antibody Latest Ref Range: 0.0 - 0.9 IU/mL  <1.0      Assessment & Plan:   1.  Follicular variant papillary thyroid cancer- pT3a, pNx  -Surgical findings, completion of treatment and long-term surveillance have been discussed with both of her grandparents. -She is status post near total thyroidectomy.  She will require adjuvant therapy with I-131 thyroid remnant ablation stimulated by Thyrogen injection.  -She is status post I-131 thyroid remnant ablation on September 20, 2018, with whole-body scan post therapy on September 30, 2018 showing absence of distant metastasis, positive for bilateral thyroid remnants.  -She has stimulated thyroglobulin level of 9.8 indicative of  thyroid remnant. -Given the finding of 1 cm left cervical lymph node on physical exam, she will be considered  for neck/thyroid ultrasound before her next visit in 6 months.      She will need at least one form of thyroid/neck imaging yearly for the next 5 years for long-term tumor surveillance.   2.  Postsurgical hypothyroidism -Her previsit thyroid function tests are consistent with appropriate replacement.  The high TSH is a result of Thyrogen stimulation hence will be disregarded for now.  He is advised to continue levothyroxine 100 mcg p.o. every morning before breakfast.    - We discussed about correct intake of levothyroxine, at fasting, with water, separated by at least 30 minutes from breakfast, and separated by more than 4 hours from calcium, iron, multivitamins, acid reflux medications (PPIs). -Patient is made aware of the fact that thyroid hormone replacement is needed for life, dose to be adjusted by periodic monitoring of thyroid function tests.  -Her previsit labs show calcium of 9.4.  She is advised to discontinue calcium at this point.    - Time spent with the patient: 15 min, of which >50% was spent in reviewing her  current and  previous labs, imaging studies, previous treatments, and medications doses and developing a plan for long-term care.  Sherri Wiggins participated in the discussions, expressed understanding, and voiced agreement with the above plans.  All questions were answered to her satisfaction. she is encouraged to contact clinic should she have any questions or concerns prior to her return visit.  Follow up plan: Return in about 6 months (around 05/08/2019) for Follow up with Pre-visit Labs, Thyroid / Neck Ultrasound.   Glade Lloyd, MD [facility] [address] [place], [place] [address] Phone: [phone]  Fax: [phone]     11/07/2018, 4:32 PM  This note was partially dictated with voice recognition software. Similar sounding words can be transcribed inadequately or may not  be corrected upon review.

## 2018-11-19 IMAGING — US US SOFT TISSUE HEAD/NECK
1 series · 13 of 25 positions shown · non-contrast
Comparison: None.

CLINICAL DATA: Palpable abnormality. 21-year-old female with a
right-sided palpable nodule.

EXAM:
THYROID ULTRASOUND
TECHNIQUE: Ultrasound examination of the thyroid gland and adjacent soft
tissues was performed.

[Series 1: us soft tissue head/neck · 0.06mm/px · 13 of 51 slices shown]
[im 1/51]
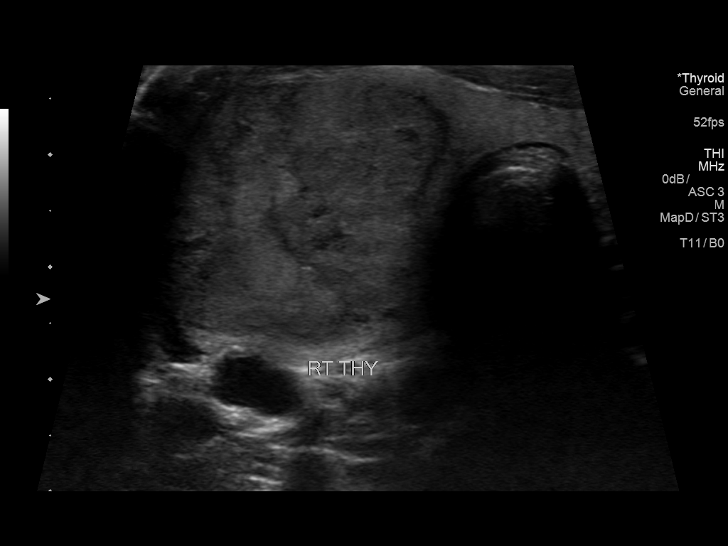
[im 5/51]
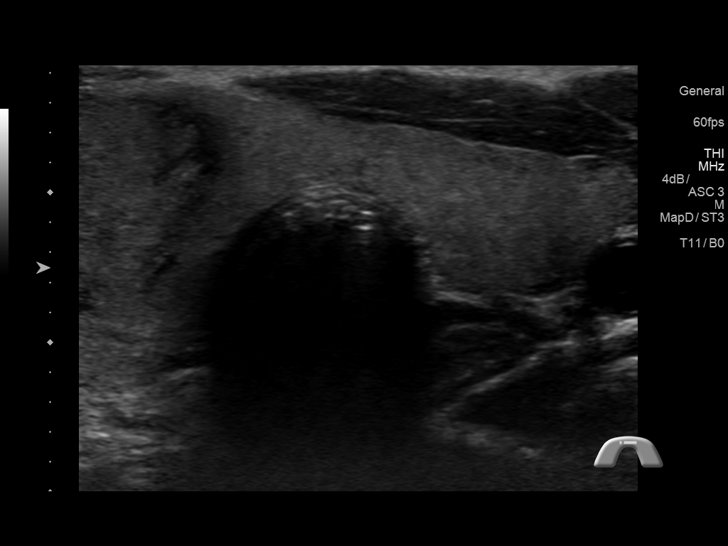
[im 9/51]
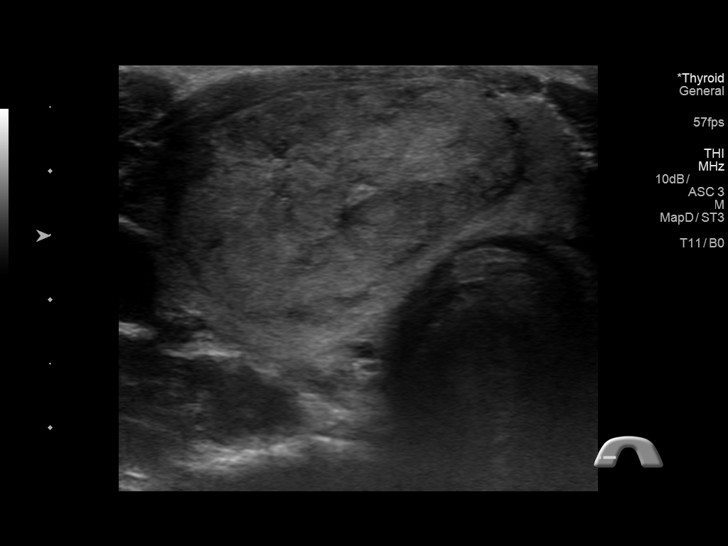
[im 13/51]
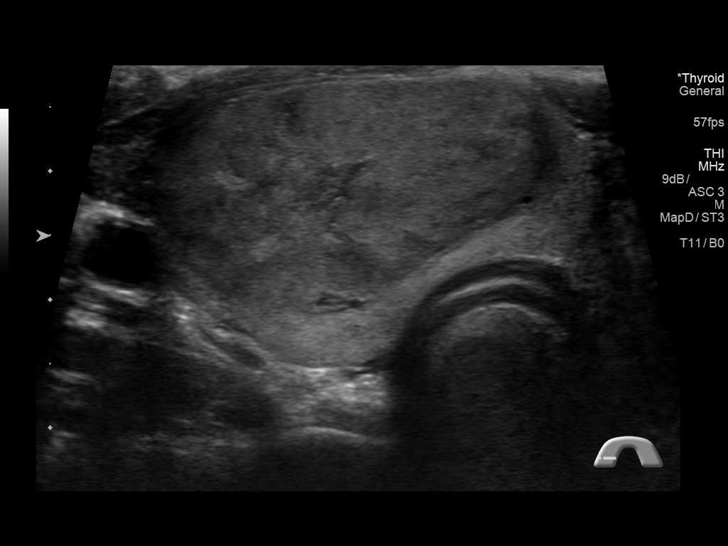
[im 17/51]
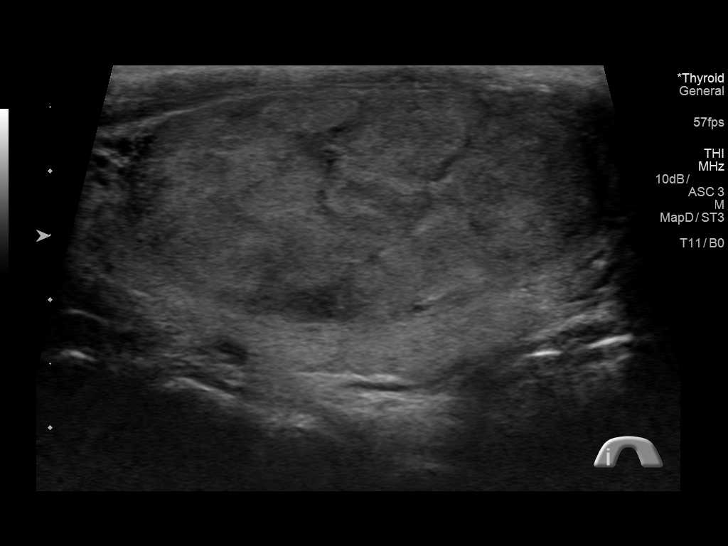
[im 21/51]
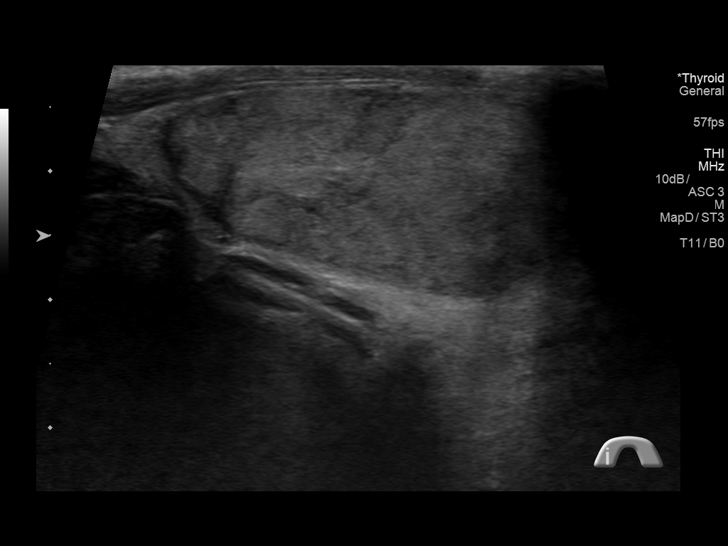
[im 26/51]
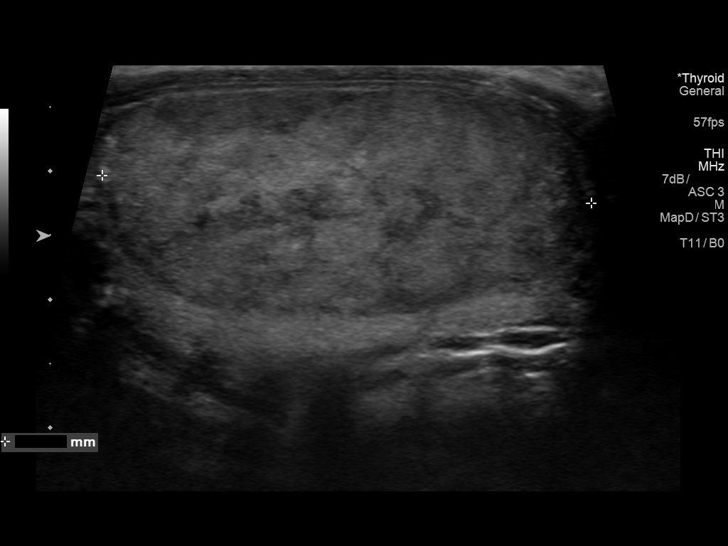
[im 30/51]
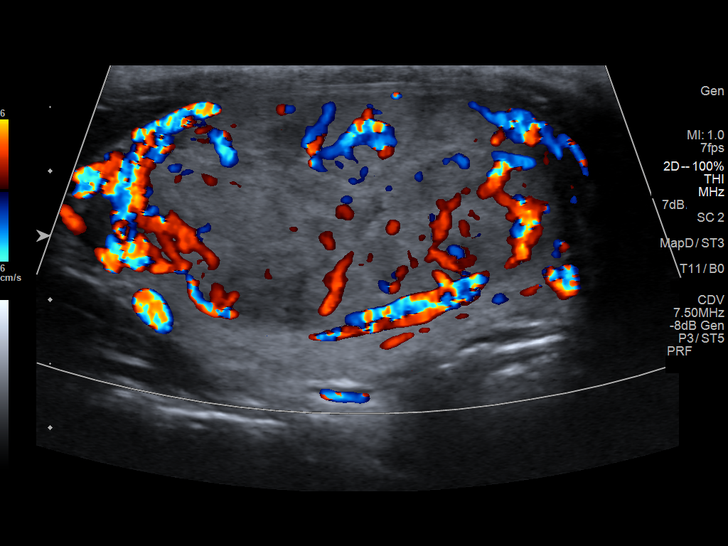
[im 34/51]
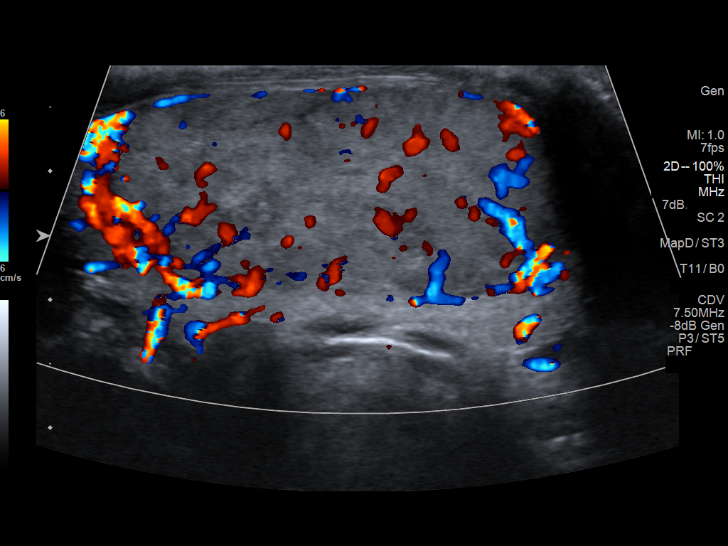
[im 38/51]
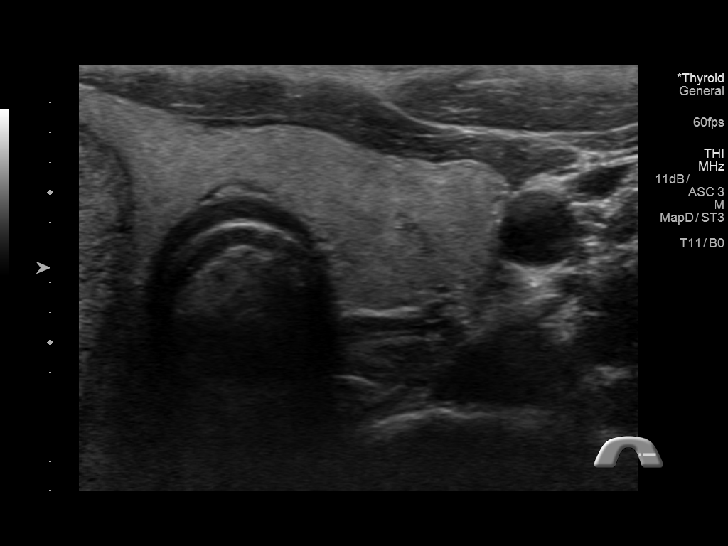
[im 42/51]
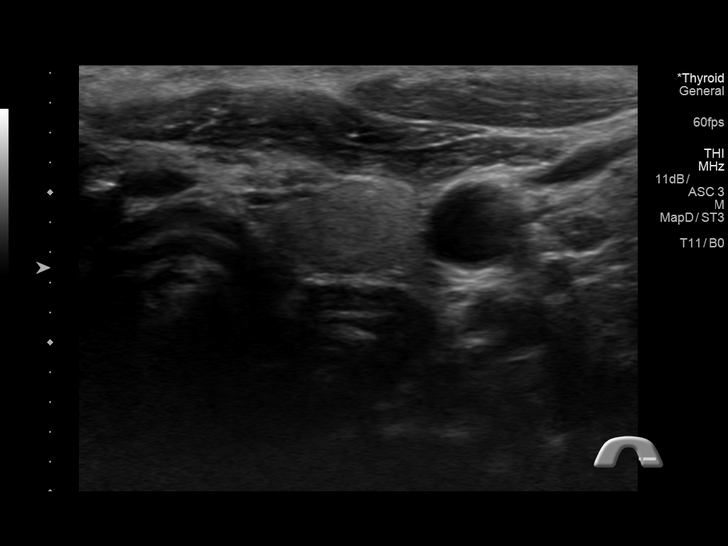
[im 46/51]
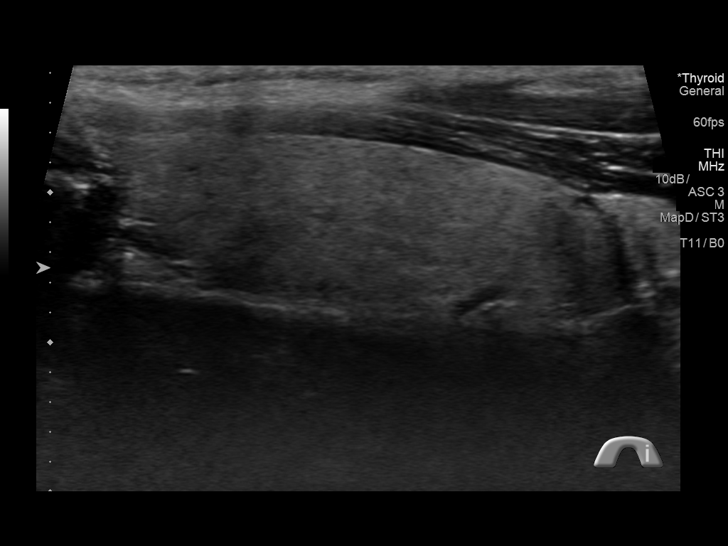
[im 51/51]
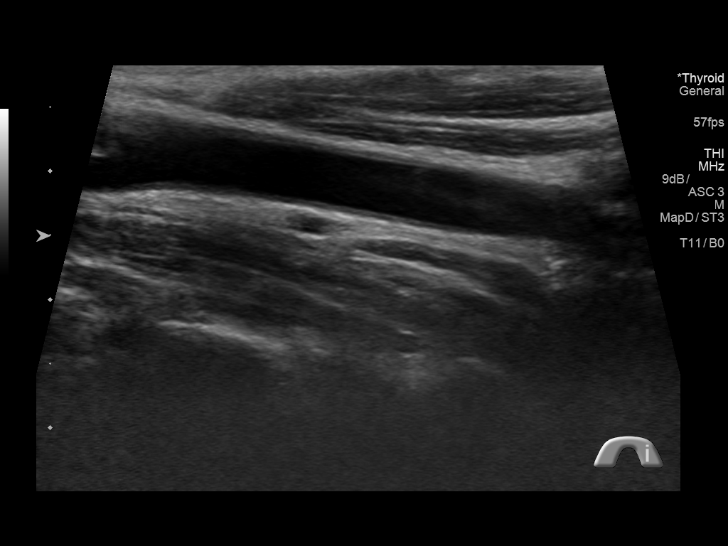

[13 of 25 positions shown; findings below may reference images not displayed]

FINDINGS: Parenchymal Echotexture: Normal

Isthmus: 0.4 cm

Right lobe: 4.9 x 2.3 x 2.7 cm

Left lobe: 4.4 x 1.1 x 1.3 cm

_________________________________________________________

Estimated total number of nodules >/= 1 cm: 1

Number of spongiform nodules >/=  2 cm not described below (TR1): 0

Number of mixed cystic and solid nodules >/= 1.5 cm not described
below (TR2): 0

_________________________________________________________

Nodule # 1:

Location: Right; mid

Maximum size: 3.8 cm; Other 2 dimensions: 3.1 x 1.9 cm

Composition: solid/almost completely solid (2)

Echogenicity: hypoechoic (2)

Shape: not taller-than-wide (0)

Margins: smooth (0)

Echogenic foci: none (0)

ACR TI-RADS total points: 4.

ACR TI-RADS risk category: TR4 (4-6 points).

ACR TI-RADS recommendations:

**Given size (>/= 1.5 cm) and appearance, fine needle aspiration of
this moderately suspicious nodule should be considered based on
TI-RADS criteria.

_________________________________________________________
IMPRESSION: Solitary 3.8 cm TI-RADS category 4 nodule in the right mid gland
meets criteria to consider fine-needle aspiration biopsy.

The above is in keeping with the ACR TI-RADS recommendations - [HOSPITAL] 2269;[DATE].

## 2018-12-20 ENCOUNTER — Encounter: Payer: Self-pay | Admitting: Adult Health

## 2018-12-20 ENCOUNTER — Encounter (INDEPENDENT_AMBULATORY_CARE_PROVIDER_SITE_OTHER): Payer: Self-pay

## 2018-12-20 ENCOUNTER — Ambulatory Visit: Payer: Medicaid Other | Admitting: Adult Health

## 2018-12-20 VITALS — BP 120/79 | HR 81 | Ht 64.0 in | Wt 115.0 lb

## 2018-12-20 DIAGNOSIS — Z3202 Encounter for pregnancy test, result negative: Secondary | ICD-10-CM | POA: Diagnosis not present

## 2018-12-20 DIAGNOSIS — Z30013 Encounter for initial prescription of injectable contraceptive: Secondary | ICD-10-CM | POA: Diagnosis not present

## 2018-12-20 LAB — POCT URINE PREGNANCY: Preg Test, Ur: NEGATIVE

## 2018-12-20 MED ORDER — MEDROXYPROGESTERONE ACETATE 150 MG/ML IM SUSP: 150.0000 mg | INTRAMUSCULAR | 4 refills | Status: DC

## 2018-12-20 NOTE — Patient Instructions (Signed)

## 2018-12-20 NOTE — Progress Notes (Signed)
Patient ID: SELENI Wiggins, female   DOB: 12-Apr-1996, 23 y.o.   MRN: [id number] History of Present Illness: Sherri Wiggins is a biracial 23 year old female in complaining of headaches, and thinks it is her Sherri Wiggins, she wants to try depo. Her mom is with her.  PCP is Dr Sherri Wiggins.  Current Medications, Allergies, Past Medical History, Past Surgical History, Family History and Social History were reviewed in [employer] record.     Review of Systems: Patient denies any  hearing loss, fatigue, blurred vision, shortness of breath, chest pain, abdominal pain, problems with bowel movements, urination, or intercourse(not currently active). No joint pain or mood swings. Has headaches almost daily, wants to stop taytulla and start depo.   Physical Exam:BP 120/79 (BP Location: Left Arm, Patient Position: Sitting, Cuff Size: Normal)   Pulse 81   Ht 5\' 4"  (1.626 m)   Wt 115 lb (52.2 kg)   BMI 19.74 kg/m  UPT is negative.  General:  Well developed, well nourished, no acute distress Skin:  Warm and dry Neck:  Midline trachea, thyroid is absent, good ROM,has 2 cm firm, non tender nodule, left neck, ?lymph node, has seen Dr Sherri Wiggins and he is getting her seen for F/U   Lungs; Clear to auscultation bilaterally Cardiovascular: Regular rate and rhythm  Psych:  No mood changes, alert and cooperative,seems happy Fall risk is low. Finish current pack of taytulla and call for appt for first depo when on period.And take MV with calcium [phone] mg    Impression: 1. Encounter for initial prescription of injectable contraceptive   2. Pregnancy examination or test, negative result       Plan: Meds ordered this encounter  Medications  . medroxyPROGESTERone (DEPO-PROVERA) 150 MG/ML injection    Sig: Inject 1 mL (150 mg total) into the muscle every 3 (three) months.    Dispense:  1 mL    Refill:  4    Order Specific Question:   Supervising Provider    Answer:   Florian Buff [[id number]]  Call  with period to get depo Call Dr Sherri Wiggins in F/U Pap and physical in 4 months withme  Review handout on depo.

## 2019-01-01 ENCOUNTER — Ambulatory Visit (INDEPENDENT_AMBULATORY_CARE_PROVIDER_SITE_OTHER): Payer: Medicaid Other | Admitting: *Deleted

## 2019-01-01 DIAGNOSIS — Z3202 Encounter for pregnancy test, result negative: Secondary | ICD-10-CM

## 2019-01-01 DIAGNOSIS — Z3042 Encounter for surveillance of injectable contraceptive: Secondary | ICD-10-CM | POA: Diagnosis not present

## 2019-01-01 LAB — POCT URINE PREGNANCY: Preg Test, Ur: NEGATIVE

## 2019-01-01 MED ORDER — MEDROXYPROGESTERONE ACETATE 150 MG/ML IM SUSP
150.0000 mg | Freq: Once | INTRAMUSCULAR | Status: AC
  Administered 2019-01-01: 150 mg via INTRAMUSCULAR

## 2019-01-01 NOTE — Progress Notes (Signed)
Pt in for depo injection. UPT is negative. No sexual activity in past month. Depo given im in left deltoid.

## 2019-03-26 ENCOUNTER — Ambulatory Visit: Payer: Self-pay

## 2019-03-28 ENCOUNTER — Other Ambulatory Visit: Payer: Self-pay

## 2019-03-28 ENCOUNTER — Ambulatory Visit: Payer: Medicaid Other

## 2019-04-01 ENCOUNTER — Ambulatory Visit (INDEPENDENT_AMBULATORY_CARE_PROVIDER_SITE_OTHER): Payer: Medicaid Other | Admitting: *Deleted

## 2019-04-01 ENCOUNTER — Other Ambulatory Visit: Payer: Self-pay

## 2019-04-01 DIAGNOSIS — Z3042 Encounter for surveillance of injectable contraceptive: Secondary | ICD-10-CM

## 2019-04-01 MED ORDER — MEDROXYPROGESTERONE ACETATE 150 MG/ML IM SUSP
150.0000 mg | Freq: Once | INTRAMUSCULAR | Status: AC
  Administered 2019-04-01: 10:00:00 150 mg via INTRAMUSCULAR

## 2019-04-01 NOTE — Progress Notes (Signed)
Depo Provera 150mg IM given in right deltoid with no complications. Pt to return in 12 weeks for next injection.  

## 2019-04-03 ENCOUNTER — Telehealth: Payer: Self-pay | Admitting: "Endocrinology

## 2019-04-03 NOTE — Telephone Encounter (Signed)
Prior Authoration # for Thyroid U/S [id number]

## 2019-04-04 ENCOUNTER — Ambulatory Visit (HOSPITAL_COMMUNITY): Admission: RE | Admit: 2019-04-04 | Payer: Medicaid Other | Source: Ambulatory Visit

## 2019-04-08 ENCOUNTER — Ambulatory Visit: Payer: Medicaid Other | Admitting: "Endocrinology

## 2019-04-10 ENCOUNTER — Ambulatory Visit (HOSPITAL_COMMUNITY)
Admission: RE | Admit: 2019-04-10 | Discharge: 2019-04-10 | Disposition: A | Discharge status: Home / Self Care | Payer: Medicaid Other | Source: Ambulatory Visit | Attending: "Endocrinology | Admitting: "Endocrinology

## 2019-04-10 ENCOUNTER — Other Ambulatory Visit: Payer: Self-pay

## 2019-04-10 DIAGNOSIS — C73 Malignant neoplasm of thyroid gland: Secondary | ICD-10-CM | POA: Insufficient documentation

## 2019-04-11 ENCOUNTER — Other Ambulatory Visit (HOSPITAL_COMMUNITY)
Admission: RE | Admit: 2019-04-11 | Discharge: 2019-04-11 | Disposition: A | Discharge status: Home / Self Care | Payer: Medicaid Other | Source: Ambulatory Visit | Attending: "Endocrinology | Admitting: "Endocrinology

## 2019-04-11 DIAGNOSIS — C73 Malignant neoplasm of thyroid gland: Secondary | ICD-10-CM | POA: Diagnosis present

## 2019-04-11 DIAGNOSIS — E890 Postprocedural hypothyroidism: Secondary | ICD-10-CM | POA: Diagnosis not present

## 2019-04-11 LAB — T4, FREE: Free T4: 1.44 ng/dL (ref 0.82–1.77)

## 2019-04-11 LAB — TSH: TSH: 1.169 u[IU]/mL (ref 0.350–4.500)

## 2019-04-14 LAB — THYROGLOBULIN ANTIBODY: Thyroglobulin Antibody: <1 IU/mL (ref 0.0–0.9)

## 2019-04-15 LAB — THYROGLOBULIN LEVEL: Thyroglobulin: <2 ng/mL

## 2019-04-17 ENCOUNTER — Encounter: Payer: Self-pay | Admitting: "Endocrinology

## 2019-04-17 ENCOUNTER — Ambulatory Visit (INDEPENDENT_AMBULATORY_CARE_PROVIDER_SITE_OTHER): Payer: Medicaid Other | Admitting: "Endocrinology

## 2019-04-17 ENCOUNTER — Other Ambulatory Visit: Payer: Self-pay

## 2019-04-17 VITALS — BP 125/72 | HR 69 | Ht 62.0 in | Wt 124.0 lb

## 2019-04-17 DIAGNOSIS — E890 Postprocedural hypothyroidism: Secondary | ICD-10-CM | POA: Diagnosis not present

## 2019-04-17 NOTE — Progress Notes (Signed)
Endocrinology follow-up note                                            04/17/2019, 5:08 PM   Subjective:    Patient ID: Sherri Wiggins, female    DOB: 1996-04-18, PCP Sherri Squibb, MD   Past Medical History:  Diagnosis Date  . Asthma    as child  . 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 Retinopathy   . Seizures ([place])    One after oral surgery 10 years ago. On no meds, no more seizures  . Thyroid disease    enlarged   Past Surgical History:  Procedure Laterality Date  . BIOPSY THYROID     x 2  . CATARACT EXTRACTION W/PHACO Bilateral 04/30/2017   Procedure: CATARACT EXTRACTION PHACO AND INTRAOCULAR LENS PLACEMENT (IOC);  Surgeon: Tonny Branch, MD;  Location: [facility];  Service: Ophthalmology;  Laterality: Bilateral;  LEFT EYE CDE: 0.46 RIGHT EYE CDE: 0.00  . EYE SURGERY     laser  . HERNIA REPAIR Right   . RHINOPLASTY FOR CLEFT LIP / PALATE    . STRABISMUS SURGERY    . THYROIDECTOMY N/A 07/24/2018   Procedure: TOTAL THYROIDECTOMY;  Surgeon: Aviva Signs, MD;  Location: [facility];  Service: General;  Laterality: N/A;  . TYMPANOSTOMY TUBE PLACEMENT     Social History   Socioeconomic History  . Marital status: Single    Spouse name: Not on file  . Number of children: Not on file  . Years of education: Not on file  . Highest education level: Not on file  Occupational History  . Not on file  Social Needs  . Financial resource strain: Not on file  . Food insecurity    Worry: Not on file    Inability: Not on file  . Transportation needs    Medical: Not on file    Non-medical: Not on file  Tobacco Use  . Smoking status: Never Smoker  . Smokeless tobacco: Never Used  Substance and Sexual Activity  . Alcohol use: Not Currently  . Drug use: No  . Sexual activity: Not Currently    Birth control/protection: Condom, Injection  Lifestyle  . Physical activity    Days per week: Not on file    Minutes per session: Not on file  . Stress:  Not on file  Relationships  . Social Herbalist on phone: Not on file    Gets together: Not on file    Attends religious service: Not on file    Active member of club or organization: Not on file    Attends meetings of clubs or organizations: Not on file    Relationship status: Not on file  Other Topics Concern  . Not on file  Social History Narrative  . Not on file   Outpatient Encounter Medications as of 04/17/2019  Medication Sig  . acetaminophen (TYLENOL) 325 MG tablet Take 650 mg by mouth daily as needed for moderate pain or headache.  . levothyroxine (SYNTHROID, LEVOTHROID) 100 MCG tablet Take 1 tablet (100 mcg total) by mouth daily before breakfast.  . medroxyPROGESTERone (DEPO-PROVERA) 150 MG/ML injection Inject 1 mL (150 mg total) into the muscle every 3 (three) months.  . Melatonin 10 MG TABS Take by mouth at bedtime.  No facility-administered encounter medications on file as of 04/17/2019.    ALLERGIES: No Known Allergies  VACCINATION STATUS: Immunization History  Administered Date(s) Administered  . Influenza,inj,Quad PF,6+ Mos 07/25/2018    HPI Sherri Wiggins is 23 y.o. female who presents today with a medical history as above. she is  post near total thyroidectomy following abnormal FNA and Afirma molecular studies of asprate.  She is currently on levothyroxine 100 mcg p.o. every morning.  Her previsit thyroid/neck ultrasound is negative for any residual thyroid tissue or recurrence.  She has suboptimal intelligence,  a poor historian, accompanied by her grandparents today.  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  -After her surgery, her immediate post I-131 therapy whole body scan shows mild activity in the neck consistent with thyroid remnant, absent evidence of distant metastasis.  -She denies dysphagia, odynophagia, voice change.  She is  status post corrective surgery for cleft lip and palate in 1999.    -Her grandmother reveals that 1 of the patient great aunt was diagnosed with thyroid cancer.   Review of Systems  Constitutional: + Steady weight, no fatigue, - subjective hyperthermia/ no subjective hypothermia Eyes: no blurry vision, no xerophthalmia ENT: no sore throat, + thyroidectomy, no dysphagia/odynophagia, no hoarseness Cardiovascular: no Chest Pain, no Shortness of Breath, no palpitations, no leg swelling  Skin: no rashes Neurological: no tremors, no numbness, no tingling, no dizziness Psychiatric: no depression, no anxiety  Objective:    BP 125/72   Pulse 69   Ht 5\' 2"  (1.575 m)   Wt 124 lb (56.2 kg)   BMI 22.68 kg/m   Wt Readings from Last 3 Encounters:  04/17/19 124 lb (56.2 kg)  12/20/18 115 lb (52.2 kg)  11/07/18 112 lb (50.8 kg)    Physical Exam  Constitutional: + Light build for her height,  not in acute distress, normal state of mind Eyes: PERRLA, EOMI, no exophthalmos ENT: + Scars from her prior surgeries for cleft lip and palate repair.  Moist mucous membranes, + thyroidectomy scar on anterior lower neck, 1 cm left cervical lymphadenopathy    CMP ( most recent) CMP     Component Value Date/Time   NA 140 09/20/2018 0854   K 3.9 09/20/2018 0854   CL 106 09/20/2018 0854   CO2 23 09/20/2018 0854   GLUCOSE 83 09/20/2018 0854   BUN 10 09/20/2018 0854   CREATININE 0.88 09/20/2018 0854   CALCIUM 9.3 09/20/2018 0854   PROT 8.2 (H) 09/20/2018 0854   ALBUMIN 4.3 09/20/2018 0854   AST 16 09/20/2018 0854   ALT 8 09/20/2018 0854   ALKPHOS 39 09/20/2018 0854   BILITOT 2.1 (H) 09/20/2018 0854   GFRNONAA >60 09/20/2018 0854   GFRAA >60 09/20/2018 0854   Recent Results (from the past 2160 hour(s))  Thyroglobulin     Status: None   Collection Time: 04/11/19  1:28 PM  Result Value Ref Range   Thyroglobulin <2.0 ng/mL    Comment: (NOTE) This test was developed and its performance  characteristics determined by [facility]. It has not been cleared or approved by the Food and Drug Administration. Reference Range: Pubertal Children and Adults: <40 According to the [facility] of Clinical Biochemistry, the reference interval for Thyroglobulin (TG) should be related to euthyroid patients and not for patients who underwent thyroidectomy.  TG reference intervals for these patients depend on the  residual mass of the thyroid tissue left after surgery.  Establishing a post-operative baseline is recommended.  The assay quantitation limit is 2.0 ng/mL. Performed At: [facility] [place], [place] [id number] [place] Sheral Apley MD [id number]   Thyroglobulin antibody     Status: None   Collection Time: 04/11/19  1:28 PM  Result Value Ref Range   Thyroglobulin Antibody <1.0 0.0 - 0.9 IU/mL    Comment: (NOTE) Thyroglobulin Antibody measured by [facility] Methodology Performed At: [facility] [place], [place] [id number] Rush Farmer MD [id number]   TSH     Status: None   Collection Time: 04/11/19  1:29 PM  Result Value Ref Range   TSH 1.169 0.350 - 4.500 uIU/mL    Comment: Performed by a 3rd Generation assay with a functional sensitivity of <=0.01 uIU/mL. Performed at [facility], [address] [place], [place] [address]   T4, free     Status: None   Collection Time: 04/11/19  1:30 PM  Result Value Ref Range   Free T4 1.44 0.82 - 1.77 ng/dL    Comment: (NOTE) Biotin ingestion may interfere with free T4 tests. If the results are inconsistent with the TSH level, previous test results, or the clinical presentation, then consider biotin interference. If needed, order repeat testing after stopping biotin. Performed at [facility], [place] [address] [place], [place] [address]    Thyroid/neck ultrasound from April 10, 2019: No regional cervical lymphadenopathy.  Post total thyroidectomy without evidence  of residual or locally recurrent disease.   Assessment & Plan:   1.  Follicular variant papillary thyroid cancer- pT3a, pNx  -Surgical findings, completion of treatment and long-term surveillance have been discussed with both of her grandparents. -She is status post near total thyroidectomy.  As well as I-131 thyroid remnant ablation with whole-body scan-completed November 2019.  Her most recent neck ultrasound did not show any lymphadenopathy nor residual thyroid or recurrent disease. -She will not need intervention or imaging at this time.  Will be considered for Thyrogen stimulated whole-body scan or another ultrasound in 1 year.   2.  Postsurgical hypothyroidism -Her previsit thyroid function tests are consistent with appropriate replacement.  She is advised to continue  levothyroxine 100 mcg p.o. every morning before breakfast.    - We discussed about the correct intake of her thyroid hormone, on empty stomach at fasting, with water, separated by at least 30 minutes from breakfast and other medications,  and separated by more than 4 hours from calcium, iron, multivitamins, acid reflux medications (PPIs). -Patient is made aware of the fact that thyroid hormone replacement is needed for life, dose to be adjusted by periodic monitoring of thyroid function tests.   - Time spent with the patient: 15 minutes.  Sherri Wiggins and her grandmother participated in the discussions, expressed understanding, and voiced agreement with the above plans.  All questions were answered to her satisfaction. she is encouraged to contact clinic should she have any questions or concerns prior to her return visit.   Follow up plan: Return in about 6 months (around 10/17/2019) for Follow up with Pre-visit Labs.   Glade Lloyd, MD [facility] [address] [place],  [address] Phone: [phone]  Fax: [phone]     04/17/2019, 5:08  PM  This note was partially dictated with voice recognition software. Similar sounding words can be transcribed inadequately or may not  be corrected  upon review.

## 2019-04-21 ENCOUNTER — Other Ambulatory Visit: Payer: Self-pay

## 2019-04-21 ENCOUNTER — Ambulatory Visit: Payer: Medicaid Other | Admitting: Adult Health

## 2019-04-21 ENCOUNTER — Encounter: Payer: Self-pay | Admitting: Adult Health

## 2019-04-21 ENCOUNTER — Other Ambulatory Visit (HOSPITAL_COMMUNITY)
Admission: RE | Admit: 2019-04-21 | Discharge: 2019-04-21 | Disposition: A | Discharge status: Home / Self Care | Payer: Medicaid Other | Source: Ambulatory Visit | Attending: Adult Health | Admitting: Adult Health

## 2019-04-21 VITALS — BP 122/64 | HR 62 | Ht 61.0 in | Wt 126.0 lb

## 2019-04-21 DIAGNOSIS — R87612 Low grade squamous intraepithelial lesion on cytologic smear of cervix (LGSIL): Secondary | ICD-10-CM

## 2019-04-21 DIAGNOSIS — Z01419 Encounter for gynecological examination (general) (routine) without abnormal findings: Secondary | ICD-10-CM | POA: Diagnosis not present

## 2019-04-21 DIAGNOSIS — Z124 Encounter for screening for malignant neoplasm of cervix: Secondary | ICD-10-CM | POA: Diagnosis not present

## 2019-04-21 DIAGNOSIS — Z3042 Encounter for surveillance of injectable contraceptive: Secondary | ICD-10-CM | POA: Insufficient documentation

## 2019-04-21 DIAGNOSIS — Z0000 Encounter for general adult medical examination without abnormal findings: Secondary | ICD-10-CM | POA: Diagnosis not present

## 2019-04-21 MED ORDER — MEDROXYPROGESTERONE ACETATE 150 MG/ML IM SUSP: 150.0000 mg | INTRAMUSCULAR | 4 refills | Status: DC

## 2019-04-21 NOTE — Progress Notes (Signed)
Patient ID: KAYLON LAROCHE, female   DOB: 06/07/1996, 23 y.o.   MRN: [id number] History of Present Illness: Ashleah is a 23 year old biracial female in for well woman gyn exam and pap, her last pap was LGSIL on 04/02/18. PCP is Dr Nevada Crane.   Current Medications, Allergies, Past Medical History, Past Surgical History, Family History and Social History were reviewed in [employer] record.     Review of Systems: Patient denies any headaches, hearing loss, fatigue, blurred vision, shortness of breath, chest pain, abdominal pain, problems with bowel movements, urination, or intercourse(not currently active). No joint pain or mood swings. She is on Depo.     Physical Exam:BP 122/64 (BP Location: Right Arm, Patient Position: Sitting, Cuff Size: Normal)   Pulse (!) 45   Ht 5\' 1"  (1.549 m)   Wt 126 lb (57.2 kg)   BMI 23.81 kg/m pulse recheck 62 General:  Well developed, well nourished, no acute distress Skin:  Warm and dry Neck:  Midline trachea,  Thyroid absent, good ROM, no lymphadenopathy Lungs; Clear to auscultation bilaterally Breast:  No dominant palpable mass, retraction, or nipple discharge Cardiovascular: Regular rate and rhythm Abdomen:  Soft, non tender, no hepatosplenomegaly Pelvic:  External genitalia is normal in appearance, no lesions.  The vagina is normal in appearance. Urethra has no lesions or masses. The cervix is smooth and poorly visualized, pap with GC/CHL and reflex HPV performed.  Uterus is felt to be normal size, shape, and contour.  No adnexal masses or tenderness noted.Bladder is non tender, no masses felt. Extremities/musculoskeletal:  No swelling or varicosities noted, no clubbing or cyanosis Psych:  No mood changes, alert and cooperative,seems happy Fall risk is low. PHQ 2 score 0. Examination chaperoned by Levy Pupa LPN.    Impression: 1. Encounter for gynecological examination with Papanicolaou smear of cervix   2. Routine cervical  smear   3. Low grade squamous intraepithelial lesion on cytologic smear of cervix (LGSIL)   4. Encounter for surveillance of injectable contraceptive       Plan: Meds ordered this encounter  Medications  . medroxyPROGESTERone (DEPO-PROVERA) 150 MG/ML injection    Sig: Inject 1 mL (150 mg total) into the muscle every 3 (three) months.    Dispense:  1 mL    Refill:  4    Order Specific Question:   Supervising Provider    Answer:   Florian Buff [[id number]]  Next depo 06/24/19 Physical in 1 year Pap in 3 if normal Use condoms if has sex

## 2019-04-23 LAB — CYTOLOGY - PAP
Chlamydia: NEGATIVE
Diagnosis: NEGATIVE
Neisseria Gonorrhea: NEGATIVE

## 2019-06-23 ENCOUNTER — Telehealth: Payer: Self-pay | Admitting: Obstetrics & Gynecology

## 2019-06-23 NOTE — Telephone Encounter (Signed)

## 2019-06-24 ENCOUNTER — Ambulatory Visit (INDEPENDENT_AMBULATORY_CARE_PROVIDER_SITE_OTHER): Payer: Medicaid Other | Admitting: *Deleted

## 2019-06-24 ENCOUNTER — Other Ambulatory Visit: Payer: Self-pay

## 2019-06-24 DIAGNOSIS — Z3042 Encounter for surveillance of injectable contraceptive: Secondary | ICD-10-CM | POA: Diagnosis not present

## 2019-06-24 MED ORDER — MEDROXYPROGESTERONE ACETATE 150 MG/ML IM SUSP
150.0000 mg | Freq: Once | INTRAMUSCULAR | Status: AC
  Administered 2019-06-24: 150 mg via INTRAMUSCULAR

## 2019-06-24 NOTE — Progress Notes (Signed)
Depo Provera 150mg IM given in right deltoid with no complications. Pt to return in 12 weeks for next injection.  

## 2019-07-30 ENCOUNTER — Other Ambulatory Visit: Payer: Self-pay | Admitting: "Endocrinology

## 2019-09-15 ENCOUNTER — Telehealth: Payer: Self-pay | Admitting: Obstetrics & Gynecology

## 2019-09-15 NOTE — Telephone Encounter (Signed)

## 2019-09-16 ENCOUNTER — Other Ambulatory Visit: Payer: Self-pay

## 2019-09-16 ENCOUNTER — Encounter: Payer: Self-pay | Admitting: *Deleted

## 2019-09-16 ENCOUNTER — Ambulatory Visit (INDEPENDENT_AMBULATORY_CARE_PROVIDER_SITE_OTHER): Payer: Medicaid Other | Admitting: *Deleted

## 2019-09-16 DIAGNOSIS — Z3202 Encounter for pregnancy test, result negative: Secondary | ICD-10-CM | POA: Diagnosis not present

## 2019-09-16 DIAGNOSIS — Z308 Encounter for other contraceptive management: Secondary | ICD-10-CM

## 2019-09-16 DIAGNOSIS — Z3042 Encounter for surveillance of injectable contraceptive: Secondary | ICD-10-CM

## 2019-09-16 LAB — POCT URINE PREGNANCY: Preg Test, Ur: NEGATIVE

## 2019-09-16 MED ORDER — MEDROXYPROGESTERONE ACETATE 150 MG/ML IM SUSP
150.0000 mg | Freq: Once | INTRAMUSCULAR | Status: AC
  Administered 2019-09-16: 150 mg via INTRAMUSCULAR

## 2019-09-16 NOTE — Progress Notes (Signed)
   NURSE VISIT- INJECTION  SUBJECTIVE:  Sherri Wiggins is a 23 y.o. G22P0010 female here for a Depo Provera for contraception/period management. She is a GYN patient.   OBJECTIVE:  There were no vitals taken for this visit.  Appears well, in no apparent distress  Injection administered in: Left deltoid  Meds ordered this encounter  Medications  . medroxyPROGESTERone (DEPO-PROVERA) injection 150 mg    ASSESSMENT: GYN patient Depo Provera for contraception/period management  PLAN: Follow-up: in 11-13 weeks for next Depo   Levy Pupa  09/16/2019 11:21 AM

## 2019-10-23 ENCOUNTER — Ambulatory Visit: Payer: Medicaid Other | Admitting: "Endocrinology

## 2019-11-10 ENCOUNTER — Other Ambulatory Visit (HOSPITAL_COMMUNITY)
Admission: RE | Admit: 2019-11-10 | Discharge: 2019-11-10 | Disposition: A | Discharge status: Home / Self Care | Payer: Medicaid Other | Source: Ambulatory Visit | Attending: "Endocrinology | Admitting: "Endocrinology

## 2019-11-10 DIAGNOSIS — E890 Postprocedural hypothyroidism: Secondary | ICD-10-CM | POA: Insufficient documentation

## 2019-11-10 LAB — TSH: TSH: 3.006 u[IU]/mL (ref 0.350–4.500)

## 2019-11-11 LAB — THYROGLOBULIN ANTIBODY: Thyroglobulin Antibody: <1 IU/mL (ref 0.0–0.9)

## 2019-11-19 ENCOUNTER — Other Ambulatory Visit: Payer: Self-pay | Admitting: "Endocrinology

## 2019-11-19 ENCOUNTER — Telehealth: Payer: Self-pay | Admitting: "Endocrinology

## 2019-11-19 DIAGNOSIS — E890 Postprocedural hypothyroidism: Secondary | ICD-10-CM

## 2019-11-19 NOTE — Telephone Encounter (Signed)
done

## 2019-11-19 NOTE — Telephone Encounter (Signed)
Patient's grandmother notified. Can you put the order back in for the T4, since it was discontinued .

## 2019-11-19 NOTE — Telephone Encounter (Signed)
Pt's grandmother called to get her reschedule since she missed her December appt. The patient is a very hard stick for blood work. It looks like her order for T4 was cancelled due to " quantity not sufficient and unable to perform" do you want to re-order this or go ahead and put her back on for the labs that she did do?

## 2019-11-19 NOTE — Telephone Encounter (Signed)
We need both free t4 and TSH repeat for her next visit on same sample. She can do in 2 weeks and visit in 3 weeks.

## 2019-12-08 ENCOUNTER — Telehealth: Payer: Self-pay | Admitting: Obstetrics & Gynecology

## 2019-12-08 NOTE — Telephone Encounter (Signed)

## 2019-12-09 ENCOUNTER — Ambulatory Visit: Payer: Medicaid Other

## 2019-12-10 ENCOUNTER — Telehealth: Payer: Self-pay | Admitting: Obstetrics & Gynecology

## 2019-12-10 NOTE — Telephone Encounter (Signed)
Tried to reach the patient to remind her of her appointment/restrictions, mb is not set up.

## 2019-12-11 ENCOUNTER — Other Ambulatory Visit: Payer: Self-pay

## 2019-12-11 ENCOUNTER — Encounter: Payer: Self-pay | Admitting: *Deleted

## 2019-12-11 ENCOUNTER — Ambulatory Visit (INDEPENDENT_AMBULATORY_CARE_PROVIDER_SITE_OTHER): Payer: Medicaid Other | Admitting: *Deleted

## 2019-12-11 DIAGNOSIS — Z3042 Encounter for surveillance of injectable contraceptive: Secondary | ICD-10-CM

## 2019-12-11 MED ORDER — MEDROXYPROGESTERONE ACETATE 150 MG/ML IM SUSP
150.0000 mg | Freq: Once | INTRAMUSCULAR | Status: AC
  Administered 2019-12-11: 15:00:00 150 mg via INTRAMUSCULAR

## 2019-12-11 NOTE — Progress Notes (Signed)
   NURSE VISIT- INJECTION  SUBJECTIVE:  Sherri Wiggins is a 24 y.o. G26P0010 female here for a Depo Provera for contraception/period management. She is a GYN patient.   OBJECTIVE:  There were no vitals taken for this visit.  Appears well, in no apparent distress  Injection administered in: Left deltoid  Meds ordered this encounter  Medications  . medroxyPROGESTERone (DEPO-PROVERA) injection 150 mg    ASSESSMENT: GYN patient Depo Provera for contraception/period management PLAN: Follow-up: in 11-13 weeks for next Depo   Alice Rieger  12/11/2019 3:21 PM

## 2019-12-23 ENCOUNTER — Other Ambulatory Visit (HOSPITAL_COMMUNITY)
Admission: RE | Admit: 2019-12-23 | Discharge: 2019-12-23 | Disposition: A | Discharge status: Home / Self Care | Payer: Medicaid Other | Source: Ambulatory Visit | Attending: "Endocrinology | Admitting: "Endocrinology

## 2019-12-23 DIAGNOSIS — E890 Postprocedural hypothyroidism: Secondary | ICD-10-CM | POA: Insufficient documentation

## 2019-12-23 LAB — TSH: TSH: 0.603 u[IU]/mL (ref 0.350–4.500)

## 2019-12-23 LAB — T4, FREE: Free T4: 1.44 ng/dL — ABNORMAL HIGH (ref 0.61–1.12)

## 2019-12-25 ENCOUNTER — Ambulatory Visit (INDEPENDENT_AMBULATORY_CARE_PROVIDER_SITE_OTHER): Payer: Medicaid Other | Admitting: "Endocrinology

## 2019-12-25 ENCOUNTER — Other Ambulatory Visit: Payer: Self-pay

## 2019-12-25 ENCOUNTER — Encounter: Payer: Self-pay | Admitting: "Endocrinology

## 2019-12-25 DIAGNOSIS — C73 Malignant neoplasm of thyroid gland: Secondary | ICD-10-CM

## 2019-12-25 DIAGNOSIS — E890 Postprocedural hypothyroidism: Secondary | ICD-10-CM | POA: Diagnosis not present

## 2019-12-25 MED ORDER — LEVOTHYROXINE SODIUM 88 MCG PO TABS: 88.0000 ug | ORAL_TABLET | Freq: Every day | ORAL | 3 refills | Status: DC

## 2019-12-25 NOTE — Progress Notes (Signed)
12/25/2019, 1:46 PM                                Endocrinology Telehealth Visit Follow up Note -During COVID -19 Pandemic  I connected with Sherri Wiggins on 12/25/2019   by telephone and verified that I am speaking with the correct person using two identifiers. Sherri Wiggins, November 06, 1996. she has verbally consented to this visit. All issues noted in this document were discussed and addressed. The format was not optimal for physical exam.   Subjective:    Patient ID: Sherri Wiggins, female    DOB: [birth year], PCP Celene Squibb, MD   Past Medical History:  Diagnosis Date  . Asthma    as child  . 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 Retinopathy   . Seizures ([place])    One after oral surgery 10 years ago. On no meds, no more seizures  . Thyroid disease    enlarged   Past Surgical History:  Procedure Laterality Date  . BIOPSY THYROID     x 2  . CATARACT EXTRACTION W/PHACO Bilateral 04/30/2017   Procedure: CATARACT EXTRACTION PHACO AND INTRAOCULAR LENS PLACEMENT (IOC);  Surgeon: Tonny Branch, MD;  Location: [facility];  Service: Ophthalmology;  Laterality: Bilateral;  LEFT EYE CDE: 0.46 RIGHT EYE CDE: 0.00  . EYE SURGERY     laser  . HERNIA REPAIR Right   . RHINOPLASTY FOR CLEFT LIP / PALATE    . STRABISMUS SURGERY    . THYROIDECTOMY N/A 07/24/2018   Procedure: TOTAL THYROIDECTOMY;  Surgeon: Aviva Signs, MD;  Location: [facility];  Service: General;  Laterality: N/A;  . TYMPANOSTOMY TUBE PLACEMENT     Social History   Socioeconomic History  . Marital status: Single    Spouse name: Not on file  . Number of children: Not on file  . Years of education: Not on file  . Highest education level: Not on file  Occupational History  . Not on file  Tobacco Use  . Smoking status: Never Smoker  . Smokeless tobacco: Never Used  Substance and Sexual Activity  . Alcohol use: Not Currently  . Drug use: No   . Sexual activity: Not Currently    Birth control/protection: Condom, Injection  Other Topics Concern  . Not on file  Social History Narrative  . Not on file   Social Determinants of Health   Financial Resource Strain:   . Difficulty of Paying Living Expenses: Not on file  Food Insecurity:   . Worried About Charity fundraiser in the Last Year: Not on file  . Ran Out of Food in the Last Year: Not on file  Transportation Needs:   . Lack of Transportation (Medical): Not on file  . Lack of Transportation (Non-Medical): Not on file  Physical Activity:   . Days of Exercise per Week: Not on file  . Minutes of Exercise per Session: Not on file  Stress:   . Feeling of Stress : Not on file  Social Connections:   . Frequency of Communication with Friends and Family: Not on file  .  Frequency of Social Gatherings with Friends and Family: Not on file  . Attends Religious Services: Not on file  . Active Member of Clubs or Organizations: Not on file  . Attends Archivist Meetings: Not on file  . Marital Status: Not on file   Outpatient Encounter Medications as of 12/25/2019  Medication Sig  . acetaminophen (TYLENOL) 325 MG tablet Take 650 mg by mouth daily as needed for moderate pain or headache.  . levothyroxine (SYNTHROID) 88 MCG tablet Take 1 tablet (88 mcg total) by mouth daily before breakfast.  . medroxyPROGESTERone (DEPO-PROVERA) 150 MG/ML injection Inject 1 mL (150 mg total) into the muscle every 3 (three) months.  . Melatonin 10 MG TABS Take by mouth at bedtime.  . Naproxen Sodium (ALEVE PO) Take by mouth as needed.  . [DISCONTINUED] levothyroxine (SYNTHROID) 100 MCG tablet TAKE 1 TABLET(100 MCG) BY MOUTH DAILY BEFORE BREAKFAST   No facility-administered encounter medications on file as of 12/25/2019.   ALLERGIES: No Known Allergies  VACCINATION STATUS: Immunization History  Administered Date(s) Administered  . Influenza,inj,Quad PF,6+ Mos 07/25/2018     HPI Sherri Wiggins is 24 y.o. female who presents today with a medical history as above. she is  post near total thyroidectomy following abnormal FNA and Afirma molecular studies of asprate.  She is currently on levothyroxine 100 mcg p.o. every morning.  Her recent  thyroid/neck ultrasound is negative for any residual thyroid tissue or recurrence.  She has suboptimal intelligence,  a poor historian, accompanied by her grandparents today.  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  -After her surgery, her immediate post I-131 therapy whole body scan shows mild activity in the neck consistent with thyroid remnant, absent evidence of distant metastasis.  -She denies dysphagia, odynophagia, voice change.  She is status post corrective surgery for cleft lip and palate in 1999.    -Her grandmother reveals that 1 of the patient great aunt was diagnosed with thyroid cancer. -Thyroid function tests are consistent  with slight over replacement.   Review of Systems   Objective:    There were no vitals taken for this visit.  Wt Readings from Last 3 Encounters:  04/21/19 126 lb (57.2 kg)  04/17/19 124 lb (56.2 kg)  12/20/18 115 lb (52.2 kg)    Physical Exam    CMP ( most recent) CMP     Component Value Date/Time   NA 140 09/20/2018 0854   K 3.9 09/20/2018 0854   CL 106 09/20/2018 0854   CO2 23 09/20/2018 0854   GLUCOSE 83 09/20/2018 0854   BUN 10 09/20/2018 0854   CREATININE 0.88 09/20/2018 0854   CALCIUM 9.3 09/20/2018 0854   PROT 8.2 (H) 09/20/2018 0854   ALBUMIN 4.3 09/20/2018 0854   AST 16 09/20/2018 0854   ALT 8 09/20/2018 0854   ALKPHOS 39 09/20/2018 0854   BILITOT 2.1 (H) 09/20/2018 0854   GFRNONAA >60 09/20/2018 0854   GFRAA >60 09/20/2018 0854   Recent Results (from the past 2160 hour(s))  Thyroglobulin antibody     Status: None   Collection Time:  11/10/19  1:17 PM  Result Value Ref Range   Thyroglobulin Antibody <1.0 0.0 - 0.9 IU/mL    Comment: (NOTE) Thyroglobulin Antibody measured by [facility] Methodology Performed At: [facility], [place] [id number] Rush Farmer MD [id number]  TSH     Status: None   Collection Time: 11/10/19  1:18 PM  Result Value Ref Range   TSH 3.006 0.350 - 4.500 uIU/mL    Comment: Performed by a 3rd Generation assay with a functional sensitivity of <=0.01 uIU/mL. Performed at [facility], [address], [place], [place] [address]   TSH     Status: None   Collection Time: 12/23/19  2:49 PM  Result Value Ref Range   TSH 0.603 0.350 - 4.500 uIU/mL    Comment: Performed by a 3rd Generation assay with a functional sensitivity of <=0.01 uIU/mL. Performed at [facility], [address] [place], [place] [address]   T4, free     Status: Abnormal   Collection Time: 12/23/19  2:49 PM  Result Value Ref Range   Free T4 1.44 (H) 0.61 - 1.12 ng/dL    Comment: (NOTE) Biotin ingestion may interfere with free T4 tests. If the results are inconsistent with the TSH level, previous test results, or the clinical presentation, then consider biotin interference. If needed, order repeat testing after stopping biotin. Performed at [facility], [place] [address], [place], [place] [address]    Thyroid/neck ultrasound from April 10, 2019: No regional cervical lymphadenopathy.  Post total thyroidectomy without evidence of residual or locally recurrent disease.   Assessment & Plan:   1.  Follicular variant papillary thyroid cancer- pT3a, pNx  -Surgical findings, completion of treatment and long-term surveillance have been discussed with both of her grandparents. -She is status post near total thyroidectomy,  as well as I-131 thyroid remnant ablation with whole-body scan-completed November 2019.  Her most recent neck ultrasound did not show any lymphadenopathy nor residual  thyroid or recurrent disease. -She will be considtred for Thyrogen stimulated WBS after her next visit.   2.  Postsurgical hypothyroidism -Her previsit thyroid function tests are consistent with  Over replacement.  She is approached for a lower dose of  Levothyroxine, 88 mcg p.o. every morning before breakfast.    - We discussed about the correct intake of her thyroid hormone, on empty stomach at fasting, with water, separated by at least 30 minutes from breakfast and other medications,  and separated by more than 4 hours from calcium, iron, multivitamins, acid reflux medications (PPIs). -Patient is made aware of the fact that thyroid hormone replacement is needed for life, dose to be adjusted by periodic monitoring of thyroid function tests.   She is advised to maintain close follow-up with her PMD Dr. Nevada Crane.    - Time spent on this patient care encounter:  25 minutes of which 50% was spent in  counseling and the rest reviewing  her current and  previous labs / studies and medications  doses and developing a plan for long term care. Sherri Wiggins  participated in the discussions, expressed understanding, and voiced agreement with the above plans.  All questions were answered to her satisfaction. she is encouraged to contact clinic should she have any questions or concerns prior to her return visit.    Follow up plan: Return in about 4 months (around 04/23/2020) for Follow up with Pre-visit Labs.   Glade Lloyd, MD [facility] [address] [place], [place] [address] Phone: [phone]  Fax: [phone]     12/25/2019, 1:46 PM  This note was partially dictated with voice recognition software. Similar sounding words can be transcribed inadequately or may not  be corrected upon review.

## 2020-02-09 IMAGING — NM NM RAI THYROID CANCER W/ THYROGEN
1 series · 1 of 1 positions shown · non-contrast
Comparison: none

CLINICAL DATA: Follicular variant of papillary thyroid cancer post
thyroidectomy, T3 disease

[Series 1: bone statics · 2.07mm/px · 1 of 1 slices shown]
[im 1/1]
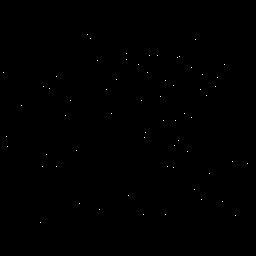

[1 of 1 positions shown; findings below may reference images not displayed]

EXAM:
RADIOACTIVE IODINE THERAPY FOR THYROID CANCER

PROCEDURE:
The risks and benefits of radioactive iodine therapy were discussed
with the patient's guardian (grandfather) in detail. Alternative
therapies were also mentioned. Radiation safety was discussed with
the patient, including how to protect the general public from
exposure. There were no barriers to communication. Written consent
was obtained. The patient then received a capsule containing the
radiopharmaceutical.

The patient will follow-up with the referring physician.

RADIOPHARMACEUTICALS:  102.3 mCi Q-ASA sodium iodide orally
FINDINGS: As above
IMPRESSION: Per oral administration of Q-ASA sodium iodide for thyroid cancer
adjuvant therapy.

## 2020-02-17 ENCOUNTER — Ambulatory Visit: Payer: Medicaid Other | Admitting: Podiatry

## 2020-02-17 ENCOUNTER — Other Ambulatory Visit: Payer: Self-pay

## 2020-02-17 DIAGNOSIS — I739 Peripheral vascular disease, unspecified: Secondary | ICD-10-CM

## 2020-02-17 DIAGNOSIS — B351 Tinea unguium: Secondary | ICD-10-CM

## 2020-02-17 DIAGNOSIS — M79674 Pain in right toe(s): Secondary | ICD-10-CM

## 2020-02-17 DIAGNOSIS — M79675 Pain in left toe(s): Secondary | ICD-10-CM

## 2020-02-17 NOTE — Patient Instructions (Signed)
Subjective:  Denies any systemic complaints such as fevers, chills, nausea, vomiting. No acute changes since last appointment, and no other complaints at this time.   Objective: AAO x3, NAD DP/PT pulses palpable bilaterally, CRT less than 3 seconds Protective sensation intact with Simms Weinstein monofilament, vibratory sensation intact, Achilles tendon reflex intact No areas of pinpoint bony tenderness or pain with vibratory sensation. MMT 5/5, ROM WNL. No edema, erythema, increase in warmth to bilateral lower extremities.  No open lesions or pre-ulcerative lesions.  No pain with calf compression, swelling, warmth, erythema  Assessment:  Plan: -All treatment options discussed with the patient including all alternatives, risks, complications.   -Patient encouraged to call the office with any questions, concerns, change in symptoms.    Pre-Operative Instructions  Congratulations, you have decided to take an important step to improving your quality of life.  You can be assured that the doctors of [facility] will be with you every step of the way.  1. Plan to be at the surgery center/hospital at least 1 (one) hour prior to your scheduled time unless otherwise directed by the surgical center/hospital staff.  You must have a responsible adult accompany you, remain during the surgery and drive you home.  Make sure you have directions to the surgical center/hospital and know how to get there on time. 2. For hospital based surgery you will need to obtain a history and physical form from your family physician within 1 month prior to the date of surgery- we will give you a form for you primary physician.  3. We make every effort to accommodate the date you request for surgery.  There are however, times where surgery dates or times have to be moved.  We will contact you as soon as possible if a change in schedule is required.   4. No Aspirin/Ibuprofen for one week before surgery.  If you are  on aspirin, any non-steroidal anti-inflammatory medications (Mobic, Aleve, Ibuprofen) you should stop taking it 7 days prior to your surgery.  You make take Tylenol  For pain prior to surgery.  5. Medications- If you are taking daily heart and blood pressure medications, seizure, reflux, allergy, asthma, anxiety, pain or diabetes medications, make sure the surgery center/hospital is aware before the day of surgery so they may notify you which medications to take or avoid the day of surgery. 6. No food or drink after midnight the night before surgery unless directed otherwise by surgical center/hospital staff. 7. No alcoholic beverages 24 hours prior to surgery.  No smoking 24 hours prior to or 24 hours after surgery. 8. Wear loose pants or shorts- loose enough to fit over bandages, boots, and casts. 9. No slip on shoes, sneakers are best. 10. Bring your boot with you to the surgery center/hospital.  Also bring crutches or a walker if your physician has prescribed it for you.  If you do not have this equipment, it will be provided for you after surgery. 11. If you have not been contracted by the surgery center/hospital by the day before your surgery, call to confirm the date and time of your surgery. 12. Leave-time from work may vary depending on the type of surgery you have.  Appropriate arrangements should be made prior to surgery with your employer. 13. Prescriptions will be provided immediately following surgery by your doctor.  Have these filled as soon as possible after surgery and take the medication as directed. 14. Remove nail polish on the operative foot. 15. Wash  the night before surgery.  The night before surgery wash the foot and leg well with the antibacterial soap provided and water paying special attention to beneath the toenails and in between the toes.  Rinse thoroughly with water and dry well with a towel.  Perform this wash unless told not to do so by your physician.  Enclosed: 1 Ice  pack (please put in freezer the night before surgery)   1 Hibiclens skin cleaner   Pre-op Instructions  If you have any questions regarding the instructions, do not hesitate to call our office at any point during this process.   [place]: [place], [place] [address] [place]: [address], [place], [place] [address] [phone]  Dr. Celesta Gentile, DPM

## 2020-02-18 NOTE — Progress Notes (Signed)
Subjective:   Patient ID: Sherri Wiggins, female   DOB: 24 y.o.   MRN: [id number]   HPI 24 year old female presents the office today for concerns of both of her big toenails becoming thickened discolored and causing pain she wants to have them removed.  No redness or drainage or any swelling.   Review of Systems  All other systems reviewed and are negative.  Past Medical History:  Diagnosis Date  . Asthma    as child  . 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 Retinopathy   . Seizures ([place])    One after oral surgery 10 years ago. On no meds, no more seizures  . Thyroid disease    enlarged    Past Surgical History:  Procedure Laterality Date  . BIOPSY THYROID     x 2  . CATARACT EXTRACTION W/PHACO Bilateral 04/30/2017   Procedure: CATARACT EXTRACTION PHACO AND INTRAOCULAR LENS PLACEMENT (IOC);  Surgeon: Tonny Branch, MD;  Location: [facility];  Service: Ophthalmology;  Laterality: Bilateral;  LEFT EYE CDE: 0.46 RIGHT EYE CDE: 0.00  . EYE SURGERY     laser  . HERNIA REPAIR Right   . RHINOPLASTY FOR CLEFT LIP / PALATE    . STRABISMUS SURGERY    . THYROIDECTOMY N/A 07/24/2018   Procedure: TOTAL THYROIDECTOMY;  Surgeon: Aviva Signs, MD;  Location: [facility];  Service: General;  Laterality: N/A;  . TYMPANOSTOMY TUBE PLACEMENT       Current Outpatient Medications:  .  acetaminophen (TYLENOL) 325 MG tablet, Take 650 mg by mouth daily as needed for moderate pain or headache., Disp: , Rfl:  .  levothyroxine (SYNTHROID) 88 MCG tablet, Take 1 tablet (88 mcg total) by mouth daily before breakfast., Disp: 30 tablet, Rfl: 3 .  medroxyPROGESTERone (DEPO-PROVERA) 150 MG/ML injection, Inject 1 mL (150 mg total) into the muscle every 3 (three) months., Disp: 1 mL, Rfl: 4 .  Melatonin 10 MG TABS, Take by mouth at bedtime., Disp: , Rfl:  .  Naproxen Sodium (ALEVE PO), Take by mouth as needed., Disp: , Rfl:   No Known Allergies       Objective:  Physical Exam   General: NAD-presents with grandfather who answers the questions.  Not able to get a history from the patient.  Dermatological: Bilateral hallux nails are significantly hypertrophic, dystrophic, discolored.  No redness or drainage or any swelling.  No open lesions.  Vascular: DP pulses 2/4, PT pulses 1/4, CRT less than 3 seconds.  Neruologic: Grossly intact via light touch bilateral.   Musculoskeletal: Syndactyly present of the second and third toes bilaterally as well as the fourth and fifth toes bilaterally.  Muscular strength 5/5 in all groups tested bilateral.  Gait: Unassisted, Nonantalgic.       Assessment:   Symptomatic onychomycosis, onychodystrophy    Plan:  -Treatment options discussed including all alternatives, risks, and complications -Etiology of symptoms were discussed -We discussed with conservative as well as surgical options.  After discussion they have elected to proceed with permanent removal of bilateral hallux toenails in total.  We discussed risks of doing this including infection, recurrence, pain.  They wish to do this but given her anxiety we will do this under sedation at the surgical center.  Consent form was signed today.  Soaking instructions were provided for postoperative use. -I did an ABI in the office which is abnormal on the right side.  We will order ABIs to be performed prior to  the procedure.  Trula Slade DPM

## 2020-02-19 ENCOUNTER — Telehealth: Payer: Self-pay | Admitting: *Deleted

## 2020-02-19 DIAGNOSIS — M79675 Pain in left toe(s): Secondary | ICD-10-CM

## 2020-02-19 DIAGNOSIS — M79674 Pain in right toe(s): Secondary | ICD-10-CM

## 2020-02-19 DIAGNOSIS — I739 Peripheral vascular disease, unspecified: Secondary | ICD-10-CM

## 2020-02-19 NOTE — Telephone Encounter (Signed)
[employer] - Medicaid website states, "Search did not return any results. Please verify search entry or try entering members name as it appears on their insurance card along with their Date of Birth. For additional help, please call [phone]."

## 2020-02-19 NOTE — Telephone Encounter (Signed)
-----   Message from Trula Slade, DPM sent at 02/18/2020  6:55 PM EDT ----- Can you please order arterial studies? Thanks.

## 2020-02-19 NOTE — Telephone Encounter (Signed)
Reviewed [employer] for release of information form, Mardene Celeste and Mellody Memos were listed on 07/17/2018 form. Left message for Mellody Memos to call concerning pt.

## 2020-02-23 NOTE — Telephone Encounter (Signed)
Pt's caregiver, Mellody Memos states he called Medicaid and would like to give the information.

## 2020-02-23 NOTE — Telephone Encounter (Signed)
I spoke with pt's grandmother, Mardene Celeste and informed pt's Medicaid number was showing up as invalid and recommend she contact Medicaid.

## 2020-02-23 NOTE — Telephone Encounter (Signed)
Pt's grandmother called wanting to know where she needs to call to schedule Sherri Wiggins's appointment for the study Dr. Jacqualyn Posey wants her to have.

## 2020-02-23 NOTE — Telephone Encounter (Signed)
I spoke with pt's caregiver, Mardene Celeste and she states the girl at [facility] says everything is in line for pt's Medicaid and they do not know why, is would not come up. I told Mardene Celeste I would call [employer] to process the pre-cert.

## 2020-02-24 NOTE — Telephone Encounter (Addendum)
[employer] - Medicaid-Alena S. states pt's member number is not showing as active, will need to create a new profile to pre-cert. Kerby Moors states Case: [id number] will go to the Eligibility Department to check eligibility and will send the determination to our fax.

## 2020-02-24 NOTE — Telephone Encounter (Signed)
I informed pt's caregiver, Mardene Celeste of the [place] - Medicaid status.

## 2020-02-25 ENCOUNTER — Other Ambulatory Visit: Payer: Self-pay | Admitting: Podiatry

## 2020-02-25 DIAGNOSIS — I739 Peripheral vascular disease, unspecified: Secondary | ICD-10-CM

## 2020-02-25 DIAGNOSIS — M79674 Pain in right toe(s): Secondary | ICD-10-CM

## 2020-02-25 DIAGNOSIS — M79675 Pain in left toe(s): Secondary | ICD-10-CM

## 2020-02-25 NOTE — Telephone Encounter (Signed)
[employer] - MEDICAID AUTHORIZATION:  [id number] FOR ABI WITH TBI [phone], VALID: 02/24/2020 - 08/22/2020. Faxed orders and authorization to [facility].

## 2020-02-26 ENCOUNTER — Ambulatory Visit: Payer: Medicaid Other

## 2020-02-26 ENCOUNTER — Other Ambulatory Visit: Payer: Self-pay

## 2020-02-26 ENCOUNTER — Ambulatory Visit (HOSPITAL_COMMUNITY)
Admission: RE | Admit: 2020-02-26 | Discharge: 2020-02-26 | Disposition: A | Discharge status: Home / Self Care | Payer: Medicaid Other | Source: Ambulatory Visit | Attending: Cardiology | Admitting: Cardiology

## 2020-02-26 DIAGNOSIS — M79674 Pain in right toe(s): Secondary | ICD-10-CM | POA: Diagnosis not present

## 2020-02-26 DIAGNOSIS — M79675 Pain in left toe(s): Secondary | ICD-10-CM | POA: Diagnosis not present

## 2020-02-26 DIAGNOSIS — I739 Peripheral vascular disease, unspecified: Secondary | ICD-10-CM | POA: Insufficient documentation

## 2020-03-01 ENCOUNTER — Telehealth: Payer: Self-pay | Admitting: Obstetrics & Gynecology

## 2020-03-01 NOTE — Telephone Encounter (Signed)

## 2020-03-02 ENCOUNTER — Other Ambulatory Visit: Payer: Self-pay

## 2020-03-02 ENCOUNTER — Encounter: Payer: Self-pay | Admitting: *Deleted

## 2020-03-02 ENCOUNTER — Ambulatory Visit (INDEPENDENT_AMBULATORY_CARE_PROVIDER_SITE_OTHER): Payer: Medicaid Other | Admitting: *Deleted

## 2020-03-02 DIAGNOSIS — Z308 Encounter for other contraceptive management: Secondary | ICD-10-CM

## 2020-03-02 DIAGNOSIS — Z3042 Encounter for surveillance of injectable contraceptive: Secondary | ICD-10-CM | POA: Diagnosis not present

## 2020-03-02 MED ORDER — MEDROXYPROGESTERONE ACETATE 150 MG/ML IM SUSP
150.0000 mg | Freq: Once | INTRAMUSCULAR | Status: AC
  Administered 2020-03-02: 150 mg via INTRAMUSCULAR

## 2020-03-02 NOTE — Progress Notes (Signed)
   NURSE VISIT- INJECTION  SUBJECTIVE:  Sherri Wiggins is a 24 y.o. G60P0010 female here for a Depo Provera for contraception/period management. She is a GYN patient.   OBJECTIVE:  There were no vitals taken for this visit.  Appears well, in no apparent distress  Injection administered in: Right deltoid  Meds ordered this encounter  Medications  . medroxyPROGESTERone (DEPO-PROVERA) injection 150 mg    ASSESSMENT: GYN patient Depo Provera for contraception/period management PLAN: Follow-up: in 11-13 weeks for next Depo   Levy Pupa  03/02/2020 9:44 AM

## 2020-03-03 ENCOUNTER — Other Ambulatory Visit: Payer: Self-pay | Admitting: Podiatry

## 2020-03-03 DIAGNOSIS — L600 Ingrowing nail: Secondary | ICD-10-CM

## 2020-03-03 MED ORDER — CEPHALEXIN 500 MG PO CAPS: 500.0000 mg | ORAL_CAPSULE | Freq: Three times a day (TID) | ORAL | 0 refills | Status: DC

## 2020-03-03 MED ORDER — HYDROCODONE-ACETAMINOPHEN 5-325 MG PO TABS: 1.0000 | ORAL_TABLET | Freq: Four times a day (QID) | ORAL | 0 refills | Status: DC | PRN

## 2020-03-03 NOTE — Progress Notes (Signed)
Postop medications sent 

## 2020-03-08 ENCOUNTER — Telehealth: Payer: Self-pay | Admitting: *Deleted

## 2020-03-08 NOTE — Telephone Encounter (Signed)
Patient's guardian called Friday April 30th, 2021 and stated that patient was really doing good and had a headache the first day and a little nausea and toes did throb some and they have already soaked the toes and used the bandaid and did see some yellow color and I stated was the patient on antibiotics and guardian (mike) stated yes and I said that if it did not look better by Monday to call the office and we would get the patient in and if any other concerns or questions to call the office and that we would see patient at the post op visit. Lattie Haw

## 2020-03-11 ENCOUNTER — Other Ambulatory Visit: Payer: Self-pay

## 2020-03-11 ENCOUNTER — Encounter: Payer: Self-pay | Admitting: Podiatry

## 2020-03-11 ENCOUNTER — Ambulatory Visit (INDEPENDENT_AMBULATORY_CARE_PROVIDER_SITE_OTHER): Payer: Medicaid Other | Admitting: Podiatry

## 2020-03-11 DIAGNOSIS — B351 Tinea unguium: Secondary | ICD-10-CM

## 2020-03-11 DIAGNOSIS — M79675 Pain in left toe(s): Secondary | ICD-10-CM

## 2020-03-11 DIAGNOSIS — M79674 Pain in right toe(s): Secondary | ICD-10-CM

## 2020-03-11 NOTE — Patient Instructions (Signed)

## 2020-03-12 MED ORDER — HYDROCODONE-ACETAMINOPHEN 5-325 MG PO TABS: 1.0000 | ORAL_TABLET | Freq: Four times a day (QID) | ORAL | 0 refills | Status: DC | PRN

## 2020-03-12 NOTE — Progress Notes (Signed)
Subjective: Sherri Wiggins is a 24 y.o.  female returns to office today for follow up evaluation after having bilteral Hallux total permanent nail avulsion performed. Patient has been soaking using epsom salts and applying topical antibiotic covered with bandaid daily.  Some discomfort to the nail beds.  She is been soaking Epson salts all that is causing some burning.  Patient denies fevers, chills, nausea, vomiting. Denies any calf pain, chest pain, SOB.   Objective:  Vitals: Reviewed  General: Well developed, nourished, in no acute distress, alert and oriented x3   Dermatology: Skin is warm, dry and supple bilateral. Bilateral hallux nail border appears to be clean, dry, with mild granular tissue. There is no surrounding erythema, edema, drainage/purulence. The remaining nails appear unremarkable at this time. There are no other lesions or other signs of infection present.  Neurovascular status: Intact. No lower extremity swelling; No pain with calf compression bilateral.  Musculoskeletal: Mild tenderness to palpation of the bilateral hallux nail bed.. Muscular strength within normal limits bilateral.   Assesement and Plan: S/p partial nail avulsion, doing well.   -Continue soaking in epsom salts twice a day followed by antibiotic ointment and a band-aid.  If the Epson salts are burning discussed switching to antibacterial soap soaks.  Can leave uncovered at night. Continue this until completely healed.  -Refill pain medicine take only as needed. -If the area has not healed in 2 weeks, call the office for follow-up appointment, or sooner if any problems arise.  -Monitor for any signs/symptoms of infection. Call the office immediately if any occur or go directly to the emergency room. Call with any questions/concerns.  Celesta Gentile, DPM

## 2020-03-25 ENCOUNTER — Encounter: Payer: Medicaid Other | Admitting: Podiatry

## 2020-04-21 LAB — THYROGLOBULIN LEVEL: Thyroglobulin: 0.1 ng/mL — ABNORMAL LOW

## 2020-04-21 LAB — THYROGLOBULIN ANTIBODY: Thyroglobulin Ab: <1 IU/mL (ref <=1)

## 2020-04-21 LAB — T4, FREE: Free T4: 1.3 ng/dL (ref 0.8–1.8)

## 2020-04-21 LAB — TSH: TSH: 10.57 mIU/L — ABNORMAL HIGH

## 2020-04-27 ENCOUNTER — Ambulatory Visit: Payer: Medicaid Other | Admitting: "Endocrinology

## 2020-05-04 ENCOUNTER — Other Ambulatory Visit: Payer: Self-pay | Admitting: "Endocrinology

## 2020-05-24 ENCOUNTER — Other Ambulatory Visit: Payer: Self-pay | Admitting: Adult Health

## 2020-05-24 ENCOUNTER — Ambulatory Visit (INDEPENDENT_AMBULATORY_CARE_PROVIDER_SITE_OTHER): Payer: Medicaid Other | Admitting: *Deleted

## 2020-05-24 DIAGNOSIS — Z3042 Encounter for surveillance of injectable contraceptive: Secondary | ICD-10-CM

## 2020-05-24 DIAGNOSIS — Z308 Encounter for other contraceptive management: Secondary | ICD-10-CM

## 2020-05-24 MED ORDER — MEDROXYPROGESTERONE ACETATE 150 MG/ML IM SUSP
150.0000 mg | Freq: Once | INTRAMUSCULAR | Status: AC
  Administered 2020-05-24: 150 mg via INTRAMUSCULAR

## 2020-05-24 NOTE — Progress Notes (Signed)
   NURSE VISIT- INJECTION  SUBJECTIVE:  Sherri Wiggins is a 24 y.o. G49P0010 female here for a Depo Provera for contraception/period management. She is a GYN patient.   OBJECTIVE:  There were no vitals taken for this visit.  Appears well, in no apparent distress  Injection administered in: Left deltoid  Meds ordered this encounter  Medications  . medroxyPROGESTERone (DEPO-PROVERA) injection 150 mg    ASSESSMENT: GYN patient Depo Provera for contraception/period management PLAN: Follow-up: in 11-13 weeks for next Depo   Levy Pupa  05/24/2020 9:56 AM

## 2020-06-01 ENCOUNTER — Other Ambulatory Visit: Payer: Self-pay

## 2020-06-01 ENCOUNTER — Other Ambulatory Visit (HOSPITAL_COMMUNITY)
Admission: RE | Admit: 2020-06-01 | Discharge: 2020-06-01 | Disposition: A | Discharge status: Home / Self Care | Payer: Medicaid Other | Source: Ambulatory Visit | Attending: Adult Health | Admitting: Adult Health

## 2020-06-01 ENCOUNTER — Encounter: Payer: Self-pay | Admitting: "Endocrinology

## 2020-06-01 ENCOUNTER — Ambulatory Visit (INDEPENDENT_AMBULATORY_CARE_PROVIDER_SITE_OTHER): Payer: Medicaid Other | Admitting: "Endocrinology

## 2020-06-01 ENCOUNTER — Ambulatory Visit (INDEPENDENT_AMBULATORY_CARE_PROVIDER_SITE_OTHER): Payer: Medicaid Other | Admitting: Adult Health

## 2020-06-01 ENCOUNTER — Encounter: Payer: Self-pay | Admitting: Adult Health

## 2020-06-01 VITALS — BP 110/80 | HR 68 | Ht 61.0 in | Wt 148.4 lb

## 2020-06-01 VITALS — BP 146/84 | HR 61 | Ht 61.0 in | Wt 147.0 lb

## 2020-06-01 DIAGNOSIS — Z0000 Encounter for general adult medical examination without abnormal findings: Secondary | ICD-10-CM

## 2020-06-01 DIAGNOSIS — C73 Malignant neoplasm of thyroid gland: Secondary | ICD-10-CM

## 2020-06-01 DIAGNOSIS — Z113 Encounter for screening for infections with a predominantly sexual mode of transmission: Secondary | ICD-10-CM | POA: Diagnosis not present

## 2020-06-01 DIAGNOSIS — E890 Postprocedural hypothyroidism: Secondary | ICD-10-CM

## 2020-06-01 DIAGNOSIS — Z3009 Encounter for other general counseling and advice on contraception: Secondary | ICD-10-CM | POA: Insufficient documentation

## 2020-06-01 DIAGNOSIS — Z01419 Encounter for gynecological examination (general) (routine) without abnormal findings: Secondary | ICD-10-CM | POA: Insufficient documentation

## 2020-06-01 MED ORDER — LEVOTHYROXINE SODIUM 100 MCG PO TABS: ORAL_TABLET | ORAL | 1 refills | Status: DC

## 2020-06-01 NOTE — Progress Notes (Signed)
06/01/2020, 10:28 PM      Endocrinology follow-up note   Subjective:    Patient ID: Sherri Wiggins, female    DOB: Mar 04, 1996, PCP Celene Squibb, MD   Past Medical History:  Diagnosis Date  . Asthma    as child  . 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 Retinopathy   . Seizures ([place])    One after oral surgery 10 years ago. On no meds, no more seizures  . Thyroid disease    enlarged  . Webbed toes of both feet    Past Surgical History:  Procedure Laterality Date  . BIOPSY THYROID     x 2  . CATARACT EXTRACTION W/PHACO Bilateral 04/30/2017   Procedure: CATARACT EXTRACTION PHACO AND INTRAOCULAR LENS PLACEMENT (IOC);  Surgeon: Tonny Branch, MD;  Location: [facility];  Service: Ophthalmology;  Laterality: Bilateral;  LEFT EYE CDE: 0.46 RIGHT EYE CDE: 0.00  . EYE SURGERY     laser  . HERNIA REPAIR Right   . RHINOPLASTY FOR CLEFT LIP / PALATE    . STRABISMUS SURGERY    . THYROIDECTOMY N/A 07/24/2018   Procedure: TOTAL THYROIDECTOMY;  Surgeon: Aviva Signs, MD;  Location: [facility];  Service: General;  Laterality: N/A;  . TYMPANOSTOMY TUBE PLACEMENT     Social History   Socioeconomic History  . Marital status: Single    Spouse name: Not on file  . Number of children: Not on file  . Years of education: Not on file  . Highest education level: Not on file  Occupational History  . Not on file  Tobacco Use  . Smoking status: Never Smoker  . Smokeless tobacco: Never Used  Vaping Use  . Vaping Use: Never used  Substance and Sexual Activity  . Alcohol use: Not Currently  . Drug use: No  . Sexual activity: Not Currently    Birth control/protection: Condom, Injection  Other Topics Concern  . Not on file  Social History Narrative  . Not on file   Social Determinants of Health   Financial Resource Strain: Low Risk   . Difficulty of Paying Living Expenses: Not very hard  Food Insecurity: No Food  Insecurity  . Worried About Charity fundraiser in the Last Year: Never true  . Ran Out of Food in the Last Year: Never true  Transportation Needs: No Transportation Needs  . Lack of Transportation (Medical): No  . Lack of Transportation (Non-Medical): No  Physical Activity: Insufficiently Active  . Days of Exercise per Week: 1 day  . Minutes of Exercise per Session: 10 min  Stress: No Stress Concern Present  . Feeling of Stress : Only a little  Social Connections: Socially Isolated  . Frequency of Communication with Friends and Family: More than three times a week  . Frequency of Social Gatherings with Friends and Family: Three times a week  . Attends Religious Services: Never  . Active Member of Clubs or Organizations: No  . Attends Archivist Meetings: Never  . Marital Status: Never married   Outpatient Encounter Medications as of 06/01/2020  Medication Sig  . Acetaminophen (MIDOL PO) Take by mouth.  Marland Kitchen acetaminophen (TYLENOL)  325 MG tablet Take 650 mg by mouth daily as needed for moderate pain or headache.  . levothyroxine (SYNTHROID) 100 MCG tablet TAKE 1 TABLET(88 MCG) BY MOUTH DAILY BEFORE BREAKFAST  . medroxyPROGESTERone (DEPO-PROVERA) 150 MG/ML injection ADMINISTER 1 ML(150 MG) IN THE MUSCLE EVERY 3 MONTHS  . Melatonin 10 MG TABS Take by mouth at bedtime.  . Naproxen Sodium (ALEVE PO) Take by mouth as needed.  . [DISCONTINUED] levothyroxine (SYNTHROID) 88 MCG tablet TAKE 1 TABLET(88 MCG) BY MOUTH DAILY BEFORE BREAKFAST   No facility-administered encounter medications on file as of 06/01/2020.   ALLERGIES: No Known Allergies  VACCINATION STATUS: Immunization History  Administered Date(s) Administered  . Influenza,inj,Quad PF,6+ Mos 07/25/2018    HPI Sherri Wiggins is 24 y.o. female who presents today with a medical history as above. she is  post near total thyroidectomy following abnormal FNA and Afirma molecular studies of asprate.  She is currently on  levothyroxine 88 mcg p.o. every morning.  Her recent  thyroid/neck ultrasound is negative for any residual thyroid tissue or recurrence. Her previsit thyroid function tests are consistent with inadequate replacement.  She has gained significant amount of weight since last visit.  She has suboptimal intelligence,  a poor historian, accompanied by her grandparents today.  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  -After her surgery, her immediate post I-131 therapy whole body scan shows mild activity in the neck consistent with thyroid remnant, absent evidence of distant metastasis.  -She denies dysphagia, odynophagia, voice change.  She is status post corrective surgery for cleft lip and palate in 1999.    -Her grandmother reveals that 1 of the patient great aunt was diagnosed with thyroid cancer. -Thyroid function tests are consistent  with slight over replacement.   Review of Systems   Objective:    BP 110/80   Pulse 68   Ht 5\' 1"  (1.549 m)   Wt 148 lb 6.4 oz (67.3 kg)   BMI 28.04 kg/m   Wt Readings from Last 3 Encounters:  06/01/20 148 lb 6.4 oz (67.3 kg)  06/01/20 147 lb (66.7 kg)  04/21/19 126 lb (57.2 kg)    Physical Exam    CMP ( most recent) CMP     Component Value Date/Time   NA 140 09/20/2018 0854   K 3.9 09/20/2018 0854   CL 106 09/20/2018 0854   CO2 23 09/20/2018 0854   GLUCOSE 83 09/20/2018 0854   BUN 10 09/20/2018 0854   CREATININE 0.88 09/20/2018 0854   CALCIUM 9.3 09/20/2018 0854   PROT 8.2 (H) 09/20/2018 0854   ALBUMIN 4.3 09/20/2018 0854   AST 16 09/20/2018 0854   ALT 8 09/20/2018 0854   ALKPHOS 39 09/20/2018 0854   BILITOT 2.1 (H) 09/20/2018 0854   GFRNONAA >60 09/20/2018 0854   GFRAA >60 09/20/2018 0854   Recent Results (from the past 2160 hour(s))  TSH     Status: Abnormal   Collection Time: 04/20/20  1:29 PM  Result Value Ref  Range   TSH 10.57 (H) mIU/L    Comment:           Reference Range .           > or = 20 Years  0.40-4.50 .                Pregnancy Ranges  First trimester    0.26-2.66           Second trimester   0.55-2.73           Third trimester    0.43-2.91   T4, free     Status: None   Collection Time: 04/20/20  1:29 PM  Result Value Ref Range   Free T4 1.3 0.8 - 1.8 ng/dL  Thyroglobulin antibody     Status: None   Collection Time: 04/20/20  1:29 PM  Result Value Ref Range   Thyroglobulin Ab <1 < or = 1 IU/mL  Thyroglobulin Level     Status: Abnormal   Collection Time: 04/20/20  1:29 PM  Result Value Ref Range   Thyroglobulin 0.1 (L) ng/mL    Comment:       Reference Range:       Intact Thyroid   2.8-40.9       Athyrotic        <0.1 .       Note: Abnormal flagging is based       on the reference interval for        patients with intact thyroid. . . This test was performed using the Beckman Coulter  chemiluminescent method. Values obtained from different assay methods cannot be used interchangeably. Thyroglobulin levels, regardless of value, should not be interpreted as absolute evidence of the presence or absence of disease. .    Comment      Comment: . Thyroglobulin antibodies (TGAB) interfere with thyroglobulin (TG) assays; therefore, TGAB assay should always be performed in conjunction with a TG assay. . . For additional information, please refer to  http://education.questdiagnostics.com/faq/FAQ202  (This link is being provided for informational/ educational purposes only.) .    Thyroid/neck ultrasound from April 10, 2019: No regional cervical lymphadenopathy.  Post total thyroidectomy without evidence of residual or locally recurrent disease.   Assessment & Plan:   1.  Follicular variant papillary thyroid cancer- pT3a, pNx  -Surgical findings, completion of treatment and long-term surveillance have been discussed with both of her grandparents. -She is  status post near total thyroidectomy,  as well as I-131 thyroid remnant ablation with whole-body scan-completed November 2019.  Her most recent neck ultrasound did not show any lymphadenopathy nor residual thyroid or recurrent disease. -She will be considtred for Thyrogen stimulated WBS after her next visit.   2.  Postsurgical hypothyroidism -Her previsit thyroid function tests are consistent with under replacement.  She is approached with a higher dose of levothyroxine at 100 mcg p.o. daily before breakfast.     - We discussed about the correct intake of her thyroid hormone, on empty stomach at fasting, with water, separated by at least 30 minutes from breakfast and other medications,  and separated by more than 4 hours from calcium, iron, multivitamins, acid reflux medications (PPIs). -Patient is made aware of the fact that thyroid hormone replacement is needed for life, dose to be adjusted by periodic monitoring of thyroid function tests.  She is advised to maintain close follow-up with her PMD Dr. Nevada Crane.     - Time spent on this patient care encounter:  20 minutes of which 50% was spent in  counseling and the rest reviewing  her current and  previous labs / studies and medications  doses and developing a plan for long term care. Sherri Wiggins  participated in the discussions, expressed understanding, and voiced agreement with the above plans.  All questions were answered to her satisfaction.  she is encouraged to contact clinic should she have any questions or concerns prior to her return visit.     Follow up plan: Return in about 6 months (around 12/02/2020) for F/U with Pre-visit Labs.   Glade Lloyd, MD [facility] [address] [place], [place] [address] Phone: [phone]  Fax: [phone]     06/01/2020, 10:28 PM  This note was partially dictated with voice recognition software. Similar sounding words can be transcribed  inadequately or may not  be corrected upon review.

## 2020-06-01 NOTE — Progress Notes (Signed)
Patient ID: Sherri Wiggins, female   DOB: May 04, 1996, 24 y.o.   MRN: [id number] History of Present Illness: Sherri Wiggins is a 24 year old biracial female, single, G1P0010, on depo, in for a well woman gyn exam and talk about nexplanon vs tubal and she wants to try nexplanon. She had normal pap 04/21/2019. PCP is Dr Nevada Crane and she sees Dr Dorris Fetch.   Current Medications, Allergies, Past Medical History, Past Surgical History, Family History and Social History were reviewed in [employer] record.     Review of Systems: Patient denies any daily headaches(has occasionally and tylenol helps), hearing loss, fatigue, blurred vision, shortness of breath, chest pain, abdominal pain, problems with bowel movements, urination, or intercourse(not currently). No joint pain or mood swings.    Physical Exam:BP (!) 146/84 (BP Location: Left Arm, Patient Position: Sitting, Cuff Size: Normal)   Pulse 61   Ht 5\' 1"  (1.549 m)   Wt 147 lb (66.7 kg)   BMI 27.78 kg/m  General:  Well developed, well nourished, no acute distress Skin:  Warm and dry Neck:  Midline trachea, normal thyroid, good ROM, no lymphadenopathy Lungs; Clear to auscultation bilaterally Breast:  No dominant palpable mass, retraction, or nipple discharge,r<L, she wants enhancement  Cardiovascular: Regular rate and rhythm Abdomen:  Soft, non tender, no hepatosplenomegaly Pelvic:  External genitalia is normal in appearance, no lesions.  The vagina is normal in appearance. Urethra has no lesions or masses. The cervix is smooth.  Uterus is felt to be normal size, shape, and contour.  No adnexal masses or tenderness noted.Bladder is non tender, no masses felt.CV swab obtained for GC/CHL Extremities/musculoskeletal:  No swelling or varicosities noted, No cyanosis, has webbed toes both feet Psych:  No mood changes, alert and cooperative,seems happy AA is 0 Fall risk is low PHQ 9 score is 9, no SI  Upstream - 06/01/20 0948       Pregnancy Intention Screening   Does the patient want to become pregnant in the next year? No    Does the patient's partner want to become pregnant in the next year? No    Would the patient like to discuss contraceptive options today? Yes      Contraception Wrap Up   Current Method Hormonal Injection    End Method Hormonal Implant    Contraception Counseling Provided Yes         Examination chaperoned by Dwyane Dee LPN  Impression and Plan: 1. Screening examination for STD (sexually transmitted disease) CV swab sent for GC/CHL  2. Encounter for well woman exam with routine gynecological exam Physical in 1 year  Pap in 2023 Labs with PCP and Dr Dorris Fetch, has appt with Dr Dorris Fetch today   3. General counseling and advice on contraceptive management Discussed nexplanon and tubal, gave handout on tubal sterilization by [facility]  Return in 8 weeks for nexplanon placement

## 2020-06-02 ENCOUNTER — Telehealth: Payer: Self-pay | Admitting: *Deleted

## 2020-06-02 LAB — CERVICOVAGINAL ANCILLARY ONLY
Chlamydia: NEGATIVE
Comment: NEGATIVE
Comment: NORMAL
Neisseria Gonorrhea: NEGATIVE

## 2020-06-02 NOTE — Telephone Encounter (Signed)
Telephoned patient at mobile number and left message.

## 2020-06-08 ENCOUNTER — Telehealth: Payer: Self-pay | Admitting: *Deleted

## 2020-06-08 NOTE — Telephone Encounter (Signed)
Patient returned call. Advised patient of negative results. Patient voiced understanding.

## 2020-06-08 NOTE — Telephone Encounter (Signed)
Telephoned patient at home number and left message.  

## 2020-07-27 ENCOUNTER — Ambulatory Visit (INDEPENDENT_AMBULATORY_CARE_PROVIDER_SITE_OTHER): Payer: Medicaid Other | Admitting: Adult Health

## 2020-07-27 ENCOUNTER — Encounter: Payer: Self-pay | Admitting: Adult Health

## 2020-07-27 VITALS — BP 131/72 | HR 49 | Ht 61.0 in | Wt 149.0 lb

## 2020-07-27 DIAGNOSIS — Z3202 Encounter for pregnancy test, result negative: Secondary | ICD-10-CM | POA: Diagnosis not present

## 2020-07-27 DIAGNOSIS — Z30017 Encounter for initial prescription of implantable subdermal contraceptive: Secondary | ICD-10-CM

## 2020-07-27 LAB — POCT URINE PREGNANCY: Preg Test, Ur: NEGATIVE

## 2020-07-27 MED ORDER — ETONOGESTREL 68 MG ~~LOC~~ IMPL
68.0000 mg | DRUG_IMPLANT | Freq: Once | SUBCUTANEOUS | Status: AC
  Administered 2020-07-27: 68 mg via SUBCUTANEOUS

## 2020-07-27 NOTE — Progress Notes (Signed)
°  Subjective:     Patient ID: Sherri Wiggins, female   DOB: [birth year], 24 y.o.   MRN: [id number]  HPI Sherri Wiggins is a 24 year old female,biracial female, single G1P0010, in for nexplanon insertion PCP is Dr Nevada Crane  Review of Systems For nexplanon insertion Reviewed past medical,surgical, social and family history. Reviewed medications and allergies.     Objective:   Physical Exam BP 131/72 (BP Location: Left Arm, Patient Position: Sitting, Cuff Size: Normal)    Pulse (!) 49    Ht 5\' 1"  (1.549 m)    Wt 149 lb (67.6 kg)    LMP 07/26/2020 (Exact Date)    BMI 28.15 kg/m UPT is negative Consent signed, time out called. Left arm cleansed with betadine, and injected with 1.5 cc 2% lidocaine and waited til numb. Nexplanon easily inserted and steri strips applied.Rod easily palpated by provider and pt. Pressure dressing applied.  Upstream - 07/27/20 1037      Pregnancy Intention Screening   Does the patient want to become pregnant in the next year? No    Does the patient's partner want to become pregnant in the next year? No    Would the patient like to discuss contraceptive options today? Yes      Contraception Wrap Up   Current Method Female Condom    End Method Hormonal Implant             Assessment:     Nexplanon insertion  Pregnancy examination or test, negative result - Plan: POCT urine pregnancy     Plan:    Use condoms x 2 weeks, keep clean and dry x 24 hours, no heavy lifting, keep steri strips on x 72 hours, Keep pressure dressing on x 24 hours. Follow up prn problems. Remove in 3 years or sooner if desired

## 2020-07-27 NOTE — Patient Instructions (Signed)
Use condoms x 2 weeks, keep clean and dry x 24 hours, no heavy lifting, keep steri strips on x 72 hours, Keep pressure dressing on x 24 hours. Follow up prn problems.  

## 2020-07-27 NOTE — Addendum Note (Signed)
Addended by: Linton Rump on: 07/27/2020 11:37 AM   Modules accepted: Orders

## 2020-08-09 ENCOUNTER — Ambulatory Visit: Payer: Medicaid Other

## 2020-08-23 ENCOUNTER — Encounter: Payer: Self-pay | Admitting: Women's Health

## 2020-08-23 ENCOUNTER — Other Ambulatory Visit: Payer: Self-pay

## 2020-08-23 ENCOUNTER — Ambulatory Visit: Payer: Self-pay | Admitting: Adult Health

## 2020-08-23 ENCOUNTER — Ambulatory Visit (INDEPENDENT_AMBULATORY_CARE_PROVIDER_SITE_OTHER): Payer: Medicaid Other | Admitting: Women's Health

## 2020-08-23 ENCOUNTER — Other Ambulatory Visit (HOSPITAL_COMMUNITY)
Admission: RE | Admit: 2020-08-23 | Discharge: 2020-08-23 | Disposition: A | Discharge status: Home / Self Care | Payer: Medicaid Other | Source: Ambulatory Visit | Attending: Adult Health | Admitting: Adult Health

## 2020-08-23 VITALS — BP 137/65 | HR 47 | Ht 62.0 in | Wt 144.0 lb

## 2020-08-23 DIAGNOSIS — N9089 Other specified noninflammatory disorders of vulva and perineum: Secondary | ICD-10-CM

## 2020-08-23 DIAGNOSIS — R110 Nausea: Secondary | ICD-10-CM

## 2020-08-23 DIAGNOSIS — R519 Headache, unspecified: Secondary | ICD-10-CM | POA: Diagnosis not present

## 2020-08-23 MED ORDER — PROMETHAZINE HCL 25 MG PO TABS: 12.5000 mg | ORAL_TABLET | Freq: Four times a day (QID) | ORAL | 0 refills | Status: DC | PRN

## 2020-08-23 NOTE — Progress Notes (Signed)
GYN VISIT Patient name: Sherri Wiggins MRN [id number]  Date of birth: 12-01-1995 Chief Complaint:   vaginal irritation (nausea & headaches since Nexplanon insertion)  History of Present Illness:   Sherri Wiggins is a 24 y.o. G59P0010 female being seen today for report of vulvar irritation/rawness for a couple of weeks, Grandmother put neosporin on it the other day. Some brown discharge, no itching/odor.  Also reports bad migraine/nausea about 3d after Nexplanon insertion on 07/27/20, has had mild daily headaches, takes apap. Had headaches w/ depo as well. Does not have headaches when not on birth control.  Requests medicine for nausea.  No h/o HTN.   Depression screen [facility] 2/9 06/01/2020 04/21/2019 04/02/2018 12/05/2017  Decreased Interest 0 0 1 1  Down, Depressed, Hopeless 1 0 0 1  PHQ - 2 Score 1 0 1 2  Altered sleeping 3 - - 0  Tired, decreased energy 2 - - 1  Change in appetite 0 - - 0  Feeling bad or failure about yourself  0 - - 0  Trouble concentrating 2 - - 0  Moving slowly or fidgety/restless 1 - - 0  Suicidal thoughts 0 - - 0  PHQ-9 Score 9 - - 3  Difficult doing work/chores Not difficult at all - - Not difficult at all    Patient's last menstrual period was 07/26/2020 (exact date). The current method of family planning is Nexplanon.  Last pap 04/21/19. Results were:  normal Review of Systems:   Pertinent items are noted in HPI Denies fever/chills, dizziness, headaches, visual disturbances, fatigue, shortness of breath, chest pain, abdominal pain, vomiting, abnormal vaginal discharge/itching/odor/irritation, problems with periods, bowel movements, urination, or intercourse unless otherwise stated above.  Pertinent History Reviewed:  Reviewed past medical,surgical, social, obstetrical and family history.  Reviewed problem list, medications and allergies. Physical Assessment:   Vitals:   08/23/20 1620 08/23/20 1643  BP: (!) 141/66 137/65  Pulse: (!) 50 (!) 47  Weight: 144  lb (65.3 kg)   Height: 5\' 2"  (1.575 m)   Body mass index is 26.34 kg/m.       Physical Examination:   General appearance: alert, well appearing, and in no distress  Mental status: alert, oriented to person, place, and time  Skin: warm & dry   Cardiovascular: normal heart rate noted  Respiratory: normal respiratory effort, no distress  Abdomen: soft, non-tender   Pelvic: VULVA: normal appearing vulva with no masses, tenderness or lesions, VAGINA: normal appearing vagina with normal color and discharge, no lesions, CERVIX: normal appearing cervix without discharge or lesions  Extremities: no edema   Chaperone: Levy Pupa    No results found for this or any previous visit (from the past 24 hour(s)).  Assessment & Plan:  1) Vulvar irritation/rawness> much improved, CV swab sent  2) Headaches/nausea> since Nexplanon insertion, could be body getting used to it, discussed seeing if improves vs removing, pt wants to see if will improve, will let us know if doesn't and wants it out. Rx phenergan for nausea  3) Initially elevated bp> normal on repeat  Meds:  Meds ordered this encounter  Medications  . promethazine (PHENERGAN) 25 MG tablet    Sig: Take 0.5-1 tablets (12.5-25 mg total) by mouth every 6 (six) hours as needed for nausea or vomiting.    Dispense:  30 tablet    Refill:  0    Order Specific Question:   Supervising Provider    Answer:   Tania Ade H [[id number]]  No orders of the defined types were placed in this encounter.   Return in about 1 year (around 08/23/2021) for Physical.  [place], [facility] 08/23/2020 4:47 PM

## 2020-08-25 LAB — CERVICOVAGINAL ANCILLARY ONLY
Bacterial Vaginitis (gardnerella): POSITIVE — AB
Candida Glabrata: NEGATIVE
Candida Vaginitis: NEGATIVE
Chlamydia: NEGATIVE
Comment: NEGATIVE
Comment: NEGATIVE
Comment: NEGATIVE
Comment: NEGATIVE
Comment: NEGATIVE
Comment: NORMAL
Neisseria Gonorrhea: NEGATIVE
Trichomonas: NEGATIVE

## 2020-08-30 ENCOUNTER — Other Ambulatory Visit: Payer: Self-pay | Admitting: Women's Health

## 2020-08-30 MED ORDER — METRONIDAZOLE 500 MG PO TABS: 500.0000 mg | ORAL_TABLET | Freq: Two times a day (BID) | ORAL | 0 refills | Status: DC

## 2020-11-25 LAB — T4, FREE: Free T4: 1.3 ng/dL (ref 0.82–1.77)

## 2020-11-25 LAB — TSH: TSH: 2.75 u[IU]/mL (ref 0.450–4.500)

## 2020-12-02 ENCOUNTER — Encounter: Payer: Self-pay | Admitting: "Endocrinology

## 2020-12-02 ENCOUNTER — Ambulatory Visit (INDEPENDENT_AMBULATORY_CARE_PROVIDER_SITE_OTHER): Payer: Medicaid Other | Admitting: "Endocrinology

## 2020-12-02 ENCOUNTER — Other Ambulatory Visit: Payer: Self-pay

## 2020-12-02 VITALS — BP 120/62 | HR 52 | Ht 62.0 in | Wt 145.2 lb

## 2020-12-02 DIAGNOSIS — C73 Malignant neoplasm of thyroid gland: Secondary | ICD-10-CM

## 2020-12-02 DIAGNOSIS — E890 Postprocedural hypothyroidism: Secondary | ICD-10-CM

## 2020-12-02 MED ORDER — LEVOTHYROXINE SODIUM 100 MCG PO TABS: 100.0000 ug | ORAL_TABLET | Freq: Every day | ORAL | 2 refills | Status: DC

## 2020-12-02 NOTE — Progress Notes (Signed)
12/02/2020, 2:25 PM      Endocrinology follow-up note   Subjective:    Patient ID: Sherri Wiggins, female    DOB: November 23, 1995, PCP Celene Squibb, MD   Past Medical History:  Diagnosis Date  . Asthma    as child  . 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 Retinopathy   . Seizures ([place])    One after oral surgery 10 years ago. On no meds, no more seizures  . Thyroid disease    enlarged  . Webbed toes of both feet    Past Surgical History:  Procedure Laterality Date  . BIOPSY THYROID     x 2  . CATARACT EXTRACTION W/PHACO Bilateral 04/30/2017   Procedure: CATARACT EXTRACTION PHACO AND INTRAOCULAR LENS PLACEMENT (IOC);  Surgeon: Tonny Branch, MD;  Location: [facility];  Service: Ophthalmology;  Laterality: Bilateral;  LEFT EYE CDE: 0.46 RIGHT EYE CDE: 0.00  . EYE SURGERY     laser  . HERNIA REPAIR Right   . RHINOPLASTY FOR CLEFT LIP / PALATE    . STRABISMUS SURGERY    . THYROIDECTOMY N/A 07/24/2018   Procedure: TOTAL THYROIDECTOMY;  Surgeon: Aviva Signs, MD;  Location: [facility];  Service: General;  Laterality: N/A;  . TYMPANOSTOMY TUBE PLACEMENT     Social History   Socioeconomic History  . Marital status: Single    Spouse name: Not on file  . Number of children: Not on file  . Years of education: Not on file  . Highest education level: Not on file  Occupational History  . Not on file  Tobacco Use  . Smoking status: Never Smoker  . Smokeless tobacco: Never Used  Vaping Use  . Vaping Use: Never used  Substance and Sexual Activity  . Alcohol use: Not Currently  . Drug use: No  . Sexual activity: Yes    Birth control/protection: Condom, Implant  Other Topics Concern  . Not on file  Social History Narrative  . Not on file   Social Determinants of Health   Financial Resource Strain: Low Risk   . Difficulty of Paying Living Expenses: Not very hard  Food Insecurity: No Food Insecurity  .  Worried About Charity fundraiser in the Last Year: Never true  . Ran Out of Food in the Last Year: Never true  Transportation Needs: No Transportation Needs  . Lack of Transportation (Medical): No  . Lack of Transportation (Non-Medical): No  Physical Activity: Insufficiently Active  . Days of Exercise per Week: 1 day  . Minutes of Exercise per Session: 10 min  Stress: No Stress Concern Present  . Feeling of Stress : Only a little  Social Connections: Socially Isolated  . Frequency of Communication with Friends and Family: More than three times a week  . Frequency of Social Gatherings with Friends and Family: Three times a week  . Attends Religious Services: Never  . Active Member of Clubs or Organizations: No  . Attends Archivist Meetings: Never  . Marital Status: Never married   Outpatient Encounter Medications as of 12/02/2020  Medication Sig  . Acetaminophen (MIDOL PO) Take by mouth.  Marland Kitchen acetaminophen (TYLENOL) 325  MG tablet Take 650 mg by mouth daily as needed for moderate pain or headache.  . etonogestrel (NEXPLANON) 68 MG IMPL implant 1 each by Subdermal route once.  Marland Kitchen levothyroxine (SYNTHROID) 100 MCG tablet Take 1 tablet (100 mcg total) by mouth daily before breakfast.  . Naproxen Sodium (ALEVE PO) Take by mouth as needed.  . promethazine (PHENERGAN) 25 MG tablet Take 0.5-1 tablets (12.5-25 mg total) by mouth every 6 (six) hours as needed for nausea or vomiting.  . [DISCONTINUED] levothyroxine (SYNTHROID) 100 MCG tablet TAKE 1 TABLET(88 MCG) BY MOUTH DAILY BEFORE BREAKFAST  . [DISCONTINUED] Melatonin 10 MG TABS Take by mouth at bedtime.  . [DISCONTINUED] metroNIDAZOLE (FLAGYL) 500 MG tablet Take 1 tablet (500 mg total) by mouth 2 (two) times daily.   No facility-administered encounter medications on file as of 12/02/2020.   ALLERGIES: No Known Allergies  VACCINATION STATUS: Immunization History  Administered Date(s) Administered  . Influenza,inj,Quad PF,6+ Mos  07/25/2018    HPI Sherri Wiggins is 25 y.o. female who presents today with a medical history as above. she is  post near total thyroidectomy following abnormal FNA and Afirma molecular studies of asprate.  She is currently on levothyroxine 88 mcg p.o. daily before breakfast.  She reports compliance to her medication.  She has no new complaints today.    Her previsit thyroid function tests are consistent with appropriate replacement.  She has steady weight since last visit.    She has suboptimal intelligence,  a poor historian, accompanied by her grandmother  today.  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  -After her surgery, her immediate post I-131 therapy whole body scan shows mild activity in the neck consistent with thyroid remnant, absent evidence of distant metastasis. - Her recent  thyroid/neck ultrasound is negative for any residual thyroid tissue or recurrence.  -She denies dysphagia, odynophagia, voice change.  She is status post corrective surgery for cleft lip and palate in 1999.    -Her grandmother reveals that 1 of the patient great aunt was diagnosed with thyroid cancer. -Thyroid function tests are consistent  with slight over replacement.   Review of Systems   Objective:    BP 120/62   Pulse (!) 52   Ht 5\' 2"  (1.575 m)   Wt 145 lb 3.2 oz (65.9 kg)   BMI 26.56 kg/m   Wt Readings from Last 3 Encounters:  12/02/20 145 lb 3.2 oz (65.9 kg)  08/23/20 144 lb (65.3 kg)  07/27/20 149 lb (67.6 kg)    Physical Exam    CMP ( most recent) CMP     Component Value Date/Time   NA 140 09/20/2018 0854   K 3.9 09/20/2018 0854   CL 106 09/20/2018 0854   CO2 23 09/20/2018 0854   GLUCOSE 83 09/20/2018 0854   BUN 10 09/20/2018 0854   CREATININE 0.88 09/20/2018 0854   CALCIUM 9.3 09/20/2018 0854   PROT 8.2 (H) 09/20/2018 0854   ALBUMIN 4.3 09/20/2018  0854   AST 16 09/20/2018 0854   ALT 8 09/20/2018 0854   ALKPHOS 39 09/20/2018 0854   BILITOT 2.1 (H) 09/20/2018 0854   GFRNONAA >60 09/20/2018 0854   GFRAA >60 09/20/2018 0854   Recent Results (from the past 2160 hour(s))  TSH     Status: None   Collection Time: 11/24/20  4:25 PM  Result Value Ref  Range   TSH 2.750 0.450 - 4.500 uIU/mL  T4, free     Status: None   Collection Time: 11/24/20  4:25 PM  Result Value Ref Range   Free T4 1.30 0.82 - 1.77 ng/dL   Thyroid/neck ultrasound from April 10, 2019: No regional cervical lymphadenopathy.  Post total thyroidectomy without evidence of residual or locally recurrent disease.   Assessment & Plan:   1.  Follicular variant papillary thyroid cancer- pT3a, pNx  -Surgical findings, completion of treatment and long-term surveillance have been discussed with both of her grandparents. -She is status post near total thyroidectomy,  as well as I-131 thyroid remnant ablation with whole-body scan-completed November 2019.  Her June 2020  neck ultrasound did not show any lymphadenopathy nor residual thyroid or recurrent disease. -She will be considtred for Thyrogen stimulated WBS after her next visit.   2.  Postsurgical hypothyroidism -Her previsit thyroid function tests are consistent with appropriate replacement.  She is advised to continue levothyroxine 100 mcg p.o. daily before breakfast.     - We discussed about the correct intake of her thyroid hormone, on empty stomach at fasting, with water, separated by at least 30 minutes from breakfast and other medications,  and separated by more than 4 hours from calcium, iron, multivitamins, acid reflux medications (PPIs). -Patient is made aware of the fact that thyroid hormone replacement is needed for life, dose to be adjusted by periodic monitoring of thyroid function tests.   She is advised to maintain close follow-up with her PMD Dr. Nevada Crane.      - Time spent on this patient care encounter:   20 minutes of which 50% was spent in  counseling and the rest reviewing  her current and  previous labs / studies and medications  doses and developing a plan for long term care. Sherri Wiggins  participated in the discussions, expressed understanding, and voiced agreement with the above plans.  All questions were answered to her satisfaction. she is encouraged to contact clinic should she have any questions or concerns prior to her return visit.   Follow up plan: Return in about 6 months (around 06/07/2021) for F/U with Pre-visit Labs, F/U with Whole Body Scan w/Thyrogen.   Glade Lloyd, MD [facility] [address] [place], [place] [address] Phone: [phone]  Fax: [phone]     12/02/2020, 2:25 PM  This note was partially dictated with voice recognition software. Similar sounding words can be transcribed inadequately or may not  be corrected upon review.

## 2020-12-29 ENCOUNTER — Encounter: Payer: Medicaid Other | Admitting: Adult Health

## 2021-01-11 ENCOUNTER — Telehealth: Payer: Self-pay | Admitting: "Endocrinology

## 2021-01-11 ENCOUNTER — Encounter: Payer: Self-pay | Admitting: Adult Health

## 2021-01-11 ENCOUNTER — Ambulatory Visit (INDEPENDENT_AMBULATORY_CARE_PROVIDER_SITE_OTHER): Payer: Medicaid Other | Admitting: Adult Health

## 2021-01-11 ENCOUNTER — Other Ambulatory Visit: Payer: Self-pay

## 2021-01-11 VITALS — BP 142/64 | HR 62 | Ht 61.0 in | Wt 143.0 lb

## 2021-01-11 DIAGNOSIS — Z3046 Encounter for surveillance of implantable subdermal contraceptive: Secondary | ICD-10-CM

## 2021-01-11 DIAGNOSIS — F32A Depression, unspecified: Secondary | ICD-10-CM | POA: Insufficient documentation

## 2021-01-11 DIAGNOSIS — Z30016 Encounter for initial prescription of transdermal patch hormonal contraceptive device: Secondary | ICD-10-CM | POA: Diagnosis not present

## 2021-01-11 LAB — POCT URINE PREGNANCY: Preg Test, Ur: NEGATIVE

## 2021-01-11 MED ORDER — ESCITALOPRAM OXALATE 10 MG PO TABS: 10.0000 mg | ORAL_TABLET | Freq: Every day | ORAL | 3 refills | Status: DC

## 2021-01-11 MED ORDER — NORELGESTROMIN-ETH ESTRADIOL 150-35 MCG/24HR TD PTWK: 1.0000 | MEDICATED_PATCH | TRANSDERMAL | 12 refills | Status: DC

## 2021-01-11 NOTE — Patient Instructions (Signed)
Use condoms x 4 weeks, keep clean and dry x 24 hours, no heavy lifting, keep steri strips on x 72 hours, Keep pressure dressing on x 24 hours. Follow up prn problems.  

## 2021-01-11 NOTE — Telephone Encounter (Signed)
Returned call and spoke with grandmother and patient advised of appt info and gave nuc. Med # for any further questions.

## 2021-01-11 NOTE — Telephone Encounter (Signed)
Pt grandmother called and said she has an appt in August and she has not heard when her whole body scan is. It looks like it has already been sch but she has not heard from anyone to get the details. Can you please contact.

## 2021-01-11 NOTE — Progress Notes (Signed)
  Subjective:     Patient ID: Sherri Wiggins, female   DOB: 14-Sep-1996, 25 y.o.   MRN: [id number]  HPI Bell[place]ia is 25 year old white female,single, G1P0010, in for nexplanon removal and get on another birth control. PCP is Dr Nevada Crane.   Review of Systems For nexplanon removal, has had headaches +depression, her mom died recently, not sleeping well  Reviewed past medical,surgical, social and family history. Reviewed medications and allergies.     Objective:   Physical Exam BP (!) 142/64 (BP Location: Left Arm, Patient Position: Sitting, Cuff Size: Normal)   Pulse 62   Ht 5\' 1"  (1.549 m)   Wt 143 lb (64.9 kg)   BMI 27.02 kg/m UPT is negative Skin warm and dry. Lungs: clear to ausculation bilaterally. Cardiovascular: regular rate and rhythm. Consent signed and time out called. Left arm cleansed with betadine, and injected with 1.5 cc 2% lidocaine and waited til numb.Under sterile technique a #11 blade was used to make small vertical incision, and a curved forceps was used to easily remove rod. Steri strips applied. Pressure dressing applied. PHQ 9 score is 7,no SI will try lexapro  Upstream - 01/11/21 1517      Pregnancy Intention Screening   Does the patient want to become pregnant in the next year? No    Does the patient's partner want to become pregnant in the next year? No    Would the patient like to discuss contraceptive options today? Yes      Contraception Wrap Up   Current Method Hormonal Implant    End Method Contraceptive Patch    Contraception Counseling Provided Yes             Assessment:     1. Encounter for Nexplanon removal Use condoms x 4 weeks, keep clean and dry x 24 hours, no heavy lifting, keep steri strips on x 72 hours, Keep pressure dressing on x 24 hours. Follow up prn problems.  2. Encounter for initial prescription of transdermal patch hormonal contraceptive device Place patch on today Meds ordered this encounter  Medications  .  norelgestromin-ethinyl estradiol (ORTHO EVRA) 150-35 MCG/24HR transdermal patch    Sig: Place 1 patch onto the skin once a week.    Dispense:  3 patch    Refill:  12    Order Specific Question:   Supervising Provider    Answer:   Elonda Husky, LUTHER H [[id number]]  . escitalopram (LEXAPRO) 10 MG tablet    Sig: Take 1 tablet (10 mg total) by mouth daily.    Dispense:  30 tablet    Refill:  3    Order Specific Question:   Supervising Provider    Answer:   Florian Buff [[id number]]   She declines GC/CHL today, no sex lately.  3. Depression, unspecified depression type Will try lexapro     Plan:     Follow up in 3 months for ROS

## 2021-04-13 ENCOUNTER — Other Ambulatory Visit: Payer: Self-pay

## 2021-04-13 ENCOUNTER — Encounter: Payer: Self-pay | Admitting: Adult Health

## 2021-04-13 ENCOUNTER — Ambulatory Visit (INDEPENDENT_AMBULATORY_CARE_PROVIDER_SITE_OTHER): Payer: Medicaid Other | Admitting: Adult Health

## 2021-04-13 VITALS — BP 112/70 | HR 50 | Ht 61.0 in | Wt 139.0 lb

## 2021-04-13 DIAGNOSIS — Z3042 Encounter for surveillance of injectable contraceptive: Secondary | ICD-10-CM

## 2021-04-13 DIAGNOSIS — F32A Depression, unspecified: Secondary | ICD-10-CM | POA: Diagnosis not present

## 2021-04-13 DIAGNOSIS — Z3202 Encounter for pregnancy test, result negative: Secondary | ICD-10-CM | POA: Diagnosis not present

## 2021-04-13 LAB — POCT URINE PREGNANCY: Preg Test, Ur: NEGATIVE

## 2021-04-13 MED ORDER — ESCITALOPRAM OXALATE 10 MG PO TABS: 10.0000 mg | ORAL_TABLET | Freq: Every day | ORAL | 12 refills | Status: DC

## 2021-04-13 MED ORDER — MEDROXYPROGESTERONE ACETATE 150 MG/ML IM SUSP
150.0000 mg | Freq: Once | INTRAMUSCULAR | Status: AC
  Administered 2021-04-13: 150 mg via INTRAMUSCULAR

## 2021-04-13 MED ORDER — MEDROXYPROGESTERONE ACETATE 150 MG/ML IM SUSP: 150.0000 mg | INTRAMUSCULAR | 4 refills | Status: DC

## 2021-04-13 NOTE — Progress Notes (Signed)
Depo Provera 150 mg given IM in left deltoid. Patient tolerated well. Next dose in 11-13 weeks.

## 2021-04-13 NOTE — Progress Notes (Signed)
  Subjective:     Patient ID: Sherri Wiggins, female   DOB: [birth year], 25 y.o.   MRN: [id number]  HPI Sherri Wiggins is a 25 year old biracial female,single, G1P0010, in for follow up on starting lexapro and ortho evra patch. She is feeling better but says she is having cramps with the patch and wants depo. PCP is Dr Nevada Crane.   Review of Systems Feels better and sleeping better on lexapro Having cramps with the patch, wants to go back on depo Reviewed past medical,surgical, social and family history. Reviewed medications and allergies.     Objective:   Physical Exam BP 112/70 (BP Location: Right Arm, Patient Position: Sitting, Cuff Size: Normal)   Pulse (!) 50   Ht 5\' 1"  (1.549 m)   Wt 139 lb (63 kg)   LMP 04/09/2021   BMI 26.26 kg/m UPT is negative Skin warm and dry.  Lungs: clear to ausculation bilaterally. Cardiovascular: regular rate and rhythm.  Upstream - 04/13/21 1524      Pregnancy Intention Screening   Does the patient want to become pregnant in the next year? No    Does the patient's partner want to become pregnant in the next year? No    Would the patient like to discuss contraceptive options today? Yes      Contraception Wrap Up   Current Method Contraceptive Patch    End Method Hormonal Injection    Contraception Counseling Provided Yes             Assessment:    1. Pregnancy test negative   2. Depression, unspecified depression type She is feeling better and sleeping better, will continue lexapro Meds ordered this encounter  Medications  . escitalopram (LEXAPRO) 10 MG tablet    Sig: Take 1 tablet (10 mg total) by mouth daily.    Dispense:  30 tablet    Refill:  12    Order Specific Question:   Supervising Provider    Answer:   Elonda Husky, LUTHER H [[id number]]  . medroxyPROGESTERone (DEPO-PROVERA) 150 MG/ML injection    Sig: Inject 1 mL (150 mg total) into the muscle every 3 (three) months.    Dispense:  1 mL    Refill:  4    Order Specific Question:   Supervising  Provider    Answer:   Elonda Husky, LUTHER H [[id number]]  . medroxyPROGESTERone (DEPO-PROVERA) injection 150 mg    3. Encounter for surveillance of injectable contraceptive Will rx depo, and give first dose in office    Plan:     Follow up in 12 weeks for depo

## 2021-05-12 ENCOUNTER — Other Ambulatory Visit: Payer: Self-pay | Admitting: Adult Health

## 2021-05-25 ENCOUNTER — Other Ambulatory Visit: Payer: Self-pay

## 2021-05-25 ENCOUNTER — Encounter (HOSPITAL_COMMUNITY): Payer: Self-pay

## 2021-05-25 ENCOUNTER — Encounter (HOSPITAL_COMMUNITY)
Admission: RE | Admit: 2021-05-25 | Discharge: 2021-05-25 | Disposition: A | Discharge status: Home / Self Care | Payer: Medicaid Other | Source: Ambulatory Visit | Attending: "Endocrinology | Admitting: "Endocrinology

## 2021-05-25 DIAGNOSIS — E890 Postprocedural hypothyroidism: Secondary | ICD-10-CM | POA: Diagnosis present

## 2021-05-25 DIAGNOSIS — C73 Malignant neoplasm of thyroid gland: Secondary | ICD-10-CM | POA: Diagnosis present

## 2021-05-25 HISTORY — DX: Malignant (primary) neoplasm, unspecified: C80.1

## 2021-05-25 MED ORDER — STERILE WATER FOR INJECTION IJ SOLN
INTRAMUSCULAR | Status: AC
  Administered 2021-05-25: 10 mL
  Filled 2021-05-25: qty 10

## 2021-05-25 MED ORDER — THYROTROPIN ALFA 0.9 MG IM SOLR: 0.9000 mg | INTRAMUSCULAR | Status: AC

## 2021-05-25 MED ORDER — THYROTROPIN ALFA 0.9 MG IM SOLR
INTRAMUSCULAR | Status: AC
  Administered 2021-05-25: 0.9 mg via INTRAMUSCULAR
  Filled 2021-05-25: qty 0.9

## 2021-05-26 ENCOUNTER — Encounter (HOSPITAL_COMMUNITY)
Admission: RE | Admit: 2021-05-26 | Discharge: 2021-05-26 | Disposition: A | Discharge status: Home / Self Care | Payer: Medicaid Other | Source: Ambulatory Visit | Attending: "Endocrinology | Admitting: "Endocrinology

## 2021-05-26 DIAGNOSIS — C73 Malignant neoplasm of thyroid gland: Secondary | ICD-10-CM

## 2021-05-26 DIAGNOSIS — E890 Postprocedural hypothyroidism: Secondary | ICD-10-CM

## 2021-05-26 MED ORDER — THYROTROPIN ALFA 0.9 MG IM SOLR: 0.9000 mg | INTRAMUSCULAR | Status: AC

## 2021-05-26 MED ORDER — THYROTROPIN ALFA 0.9 MG IM SOLR
INTRAMUSCULAR | Status: AC
  Administered 2021-05-26: 0.9 mg via INTRAMUSCULAR
  Filled 2021-05-26: qty 0.9

## 2021-05-26 MED ORDER — STERILE WATER FOR INJECTION IJ SOLN
INTRAMUSCULAR | Status: AC
  Filled 2021-05-26: qty 10

## 2021-05-27 ENCOUNTER — Encounter (HOSPITAL_COMMUNITY): Payer: Self-pay

## 2021-05-27 ENCOUNTER — Other Ambulatory Visit: Payer: Self-pay

## 2021-05-27 ENCOUNTER — Other Ambulatory Visit (HOSPITAL_COMMUNITY)
Admission: RE | Admit: 2021-05-27 | Discharge: 2021-05-27 | Disposition: A | Discharge status: Home / Self Care | Payer: Medicaid Other | Source: Ambulatory Visit | Attending: "Endocrinology | Admitting: "Endocrinology

## 2021-05-27 ENCOUNTER — Encounter (HOSPITAL_COMMUNITY)
Admission: RE | Admit: 2021-05-27 | Discharge: 2021-05-27 | Disposition: A | Discharge status: Home / Self Care | Payer: Medicaid Other | Source: Ambulatory Visit | Attending: "Endocrinology | Admitting: "Endocrinology

## 2021-05-27 DIAGNOSIS — C73 Malignant neoplasm of thyroid gland: Secondary | ICD-10-CM | POA: Diagnosis present

## 2021-05-27 DIAGNOSIS — E890 Postprocedural hypothyroidism: Secondary | ICD-10-CM | POA: Diagnosis present

## 2021-05-27 LAB — PREGNANCY, URINE: Preg Test, Ur: NEGATIVE

## 2021-05-27 LAB — TSH: TSH: 178.64 u[IU]/mL — ABNORMAL HIGH (ref 0.350–4.500)

## 2021-05-27 LAB — T4, FREE: Free T4: 1.63 ng/dL — ABNORMAL HIGH (ref 0.61–1.12)

## 2021-05-27 MED ORDER — SODIUM IODIDE I 131 CAPSULE
4.0000 | Freq: Once | INTRAVENOUS | Status: AC | PRN
  Administered 2021-05-27: 4.22 via ORAL

## 2021-05-28 LAB — THYROGLOBULIN ANTIBODY: Thyroglobulin Antibody: <1 IU/mL (ref 0.0–0.9)

## 2021-05-30 ENCOUNTER — Other Ambulatory Visit: Payer: Self-pay

## 2021-05-30 ENCOUNTER — Encounter (HOSPITAL_COMMUNITY)
Admission: RE | Admit: 2021-05-30 | Discharge: 2021-05-30 | Disposition: A | Discharge status: Home / Self Care | Payer: Medicaid Other | Source: Ambulatory Visit | Attending: "Endocrinology | Admitting: "Endocrinology

## 2021-05-30 DIAGNOSIS — E890 Postprocedural hypothyroidism: Secondary | ICD-10-CM | POA: Diagnosis not present

## 2021-06-02 ENCOUNTER — Encounter: Payer: Self-pay | Admitting: Adult Health

## 2021-06-02 ENCOUNTER — Ambulatory Visit (INDEPENDENT_AMBULATORY_CARE_PROVIDER_SITE_OTHER): Payer: Medicaid Other | Admitting: Adult Health

## 2021-06-02 ENCOUNTER — Other Ambulatory Visit: Payer: Self-pay

## 2021-06-02 VITALS — BP 127/72 | HR 62 | Ht 61.5 in | Wt 137.5 lb

## 2021-06-02 DIAGNOSIS — Z01419 Encounter for gynecological examination (general) (routine) without abnormal findings: Secondary | ICD-10-CM | POA: Diagnosis not present

## 2021-06-02 DIAGNOSIS — Z3042 Encounter for surveillance of injectable contraceptive: Secondary | ICD-10-CM

## 2021-06-02 DIAGNOSIS — N9089 Other specified noninflammatory disorders of vulva and perineum: Secondary | ICD-10-CM | POA: Diagnosis not present

## 2021-06-02 DIAGNOSIS — Z0000 Encounter for general adult medical examination without abnormal findings: Secondary | ICD-10-CM

## 2021-06-02 DIAGNOSIS — N6311 Unspecified lump in the right breast, upper outer quadrant: Secondary | ICD-10-CM

## 2021-06-02 DIAGNOSIS — F32A Depression, unspecified: Secondary | ICD-10-CM | POA: Diagnosis not present

## 2021-06-02 MED ORDER — ESCITALOPRAM OXALATE 10 MG PO TABS: 10.0000 mg | ORAL_TABLET | Freq: Every day | ORAL | 4 refills | Status: DC

## 2021-06-02 MED ORDER — NYSTATIN 100000 UNIT/GM EX CREA: 1.0000 "application " | TOPICAL_CREAM | Freq: Two times a day (BID) | CUTANEOUS | 2 refills | Status: DC

## 2021-06-02 NOTE — Patient Instructions (Signed)
Thank you for choosing our office today! We appreciate the opportunity to meet your healthcare needs. You may receive a short survey by e-mail or through MyChart. If you are happy with your care we would appreciate if you could take just a few minutes to complete the survey questions. We read all of your comments and take your feedback very seriously. Thank you again for choosing our office.  Drucilla Cumber   [facility] Team  

## 2021-06-02 NOTE — Progress Notes (Signed)
Patient ID: Sherri Wiggins, female   DOB: Oct 08, 1996, 25 y.o.   MRN: [id number] History of Present Illness: Sherri Wiggins is a 25 year old biracial female, single, G1P0010, in for a well woman gyn exam Lab Results  Component Value Date   DIAGPAP  04/21/2019    NEGATIVE FOR INTRAEPITHELIAL LESIONS OR MALIGNANCY.   PCP is Dr Nevada Crane.   Current Medications, Allergies, Past Medical History, Past Surgical History, Family History and Social History were reviewed in [employer] record.     Review of Systems:  Patient denies any headaches, hearing loss, fatigue, blurred vision, shortness of breath, chest pain, abdominal pain, problems with bowel movements, urination, or intercourse.(Not currently active). No joint pain or mood swings.    Physical Exam:BP 127/72 (BP Location: Left Arm, Patient Position: Sitting, Cuff Size: Normal)   Pulse (!) 42   Ht 5' 1.5" (1.562 m)   Wt 137 lb 8 oz (62.4 kg)   LMP 05/11/2021 (Within Days)   BMI 25.56 kg/m  Pulse recheck 62 General:  Well developed, well nourished, no acute distress Skin:  Warm and dry Neck:  Midline trachea, thyroid is surgically absent, good ROM, no lymphadenopathy Lungs; Clear to auscultation bilaterally Breast:  No dominant palpable mass, retraction, or nipple discharge, on the left, on the right, no retraction or nipple discharge, has pea sized mass at 10 o'clock 3 FB from arela near old scar and it is tender Cardiovascular: Regular rate and rhythm Abdomen:  Soft, non tender, no hepatosplenomegaly Pelvic:  External genitalia is normal in appearance, has some redness at base of labia, like chafing.  The vagina is normal in appearance. Urethra has no lesions or masses. The cervix is smooth.  Uterus is felt to be normal size, shape, and contour.  No adnexal masses or tenderness noted.Bladder is non tender, no masses felt. Rectal: Deferred. Extremities/musculoskeletal:  No swelling or varicosities noted, no clubbing or  cyanosis Psych:  No mood changes, alert and cooperative,seems happy AA is 1 Fall risk is low Depression screen [facility] 2/9 06/02/2021 04/13/2021 01/11/2021  Decreased Interest 0 0 0  Down, Depressed, Hopeless '1 1 2  '$ PHQ - 2 Score '1 1 2  '$ Altered sleeping 1 - 2  Tired, decreased energy 0 - 0  Change in appetite 0 - 0  Feeling bad or failure about yourself  0 - 0  Trouble concentrating 1 - 1  Moving slowly or fidgety/restless 1 - 2  Suicidal thoughts 0 - 0  PHQ-9 Score 4 - 7  Difficult doing work/chores - - -   ON lexapro 10 mg and happy with it GAD 7 : Generalized Anxiety Score 06/02/2021 06/01/2020  Nervous, Anxious, on Edge 0 1  Control/stop worrying 1 2  Worry too much - different things 1 3  Trouble relaxing 0 1  Restless 0 0  Easily annoyed or irritable 1 0  Afraid - awful might happen 1 1  Total GAD 7 Score 4 8  Anxiety Difficulty - Not difficult at all    Upstream - 06/02/21 1441       Pregnancy Intention Screening   Does the patient want to become pregnant in the next year? No    Does the patient's partner want to become pregnant in the next year? No    Would the patient like to discuss contraceptive options today? No      Contraception Wrap Up   Current Method Hormonal Injection    End Method Hormonal Injection  Contraception Counseling Provided No            Examination chaperoned by Levy Pupa LPN     Impression and Plan; 1. Encounter for well woman exam with routine gynecological exam Pap and physical in 1 year  She declines STD testing, not having sex.   2. Depression, unspecified depression type Continue lexapro Meds ordered this encounter  Medications   nystatin cream (MYCOSTATIN)    Sig: Apply 1 application topically 2 (two) times daily.    Dispense:  30 g    Refill:  2    Order Specific Question:   Supervising Provider    Answer:   Elonda Husky, LUTHER H [[id number]]   escitalopram (LEXAPRO) 10 MG tablet    Sig: Take 1 tablet (10 mg total) by mouth daily.     Dispense:  90 tablet    Refill:  4    Order Specific Question:   Supervising Provider    Answer:   Elonda Husky, LUTHER H [[id number]]     3. Encounter for surveillance of injectable contraceptive Has refills on depo  Next shot due about 07/04/21  4. Vulvar irritation Will rx nystatin Keep dry Go without underwear at home   5. Mass of upper outer quadrant of right breast Right breat [place] 06/07/21 at [facility] at 3:50 pm

## 2021-06-04 LAB — THYROGLOBULIN LEVEL: Thyroglobulin: <2 ng/mL

## 2021-06-07 ENCOUNTER — Ambulatory Visit (HOSPITAL_COMMUNITY)
Admission: RE | Admit: 2021-06-07 | Discharge: 2021-06-07 | Disposition: A | Discharge status: Home / Self Care | Payer: Medicaid Other | Source: Ambulatory Visit | Attending: Adult Health | Admitting: Adult Health

## 2021-06-07 ENCOUNTER — Other Ambulatory Visit: Payer: Self-pay

## 2021-06-07 ENCOUNTER — Ambulatory Visit: Payer: Medicaid Other | Admitting: "Endocrinology

## 2021-06-07 DIAGNOSIS — N6311 Unspecified lump in the right breast, upper outer quadrant: Secondary | ICD-10-CM | POA: Diagnosis present

## 2021-06-13 ENCOUNTER — Ambulatory Visit (INDEPENDENT_AMBULATORY_CARE_PROVIDER_SITE_OTHER): Payer: Medicaid Other | Admitting: "Endocrinology

## 2021-06-13 ENCOUNTER — Encounter: Payer: Self-pay | Admitting: "Endocrinology

## 2021-06-13 VITALS — BP 108/58 | HR 60 | Ht 61.5 in | Wt 138.2 lb

## 2021-06-13 DIAGNOSIS — E890 Postprocedural hypothyroidism: Secondary | ICD-10-CM | POA: Diagnosis not present

## 2021-06-13 DIAGNOSIS — C73 Malignant neoplasm of thyroid gland: Secondary | ICD-10-CM | POA: Diagnosis not present

## 2021-06-13 MED ORDER — LEVOTHYROXINE SODIUM 88 MCG PO TABS: 88.0000 ug | ORAL_TABLET | Freq: Every day | ORAL | 1 refills | Status: DC

## 2021-06-13 NOTE — Progress Notes (Signed)
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 On no meds, no more seizures   Thyroid disease    enlarged   Webbed toes of both feet    Past Surgical History:  Procedure Laterality Date   BIOPSY THYROID     x 2   CATARACT EXTRACTION W/PHACO Bilateral 04/30/2017   Procedure: CATARACT EXTRACTION PHACO AND INTRAOCULAR LENS PLACEMENT ([place]);  Surgeon: Tonny Branch, MD;  Location: [facility];  Service: Ophthalmology;  Laterality: Bilateral;  LEFT EYE CDE: 0.46 RIGHT EYE CDE: 0.00   EYE SURGERY     laser   HERNIA REPAIR Right    RHINOPLASTY FOR CLEFT LIP / PALATE     STRABISMUS SURGERY     THYROIDECTOMY N/A 07/24/2018   Procedure: TOTAL THYROIDECTOMY;  Surgeon: Aviva Signs, MD;  Location: [facility];  Service: General;  Laterality: N/A;   TYMPANOSTOMY TUBE PLACEMENT     Social History   Socioeconomic History   Marital status: Single    Spouse name: Not on file   Number of children: Not on file   Years of education: Not on file   Highest education level: Not on file  Occupational History   Not on file  Tobacco Use   Smoking status: Never   Smokeless tobacco: Never  Vaping Use   Vaping Use: Never used  Substance and Sexual Activity   Alcohol use: Not Currently   Drug use: No   Sexual activity: Not Currently    Birth control/protection: Condom, Injection  Other Topics Concern   Not on file  Social History Narrative   Not on file   Social Determinants of Health   Financial Resource Strain: Low Risk    Difficulty of Paying Living Expenses: Not very hard  Food Insecurity: No Food Insecurity    Worried About Charity fundraiser in the Last Year: Never true   Ran Out of Food in the Last Year: Never true  Transportation Needs: No Transportation Needs   Lack of Transportation (Medical): No   Lack of Transportation (Non-Medical): No  Physical Activity: Insufficiently Active   Days of Exercise per Week: 3 days   Minutes of Exercise per Session: 20 min  Stress: No Stress Concern Present   Feeling of Stress : Only a little  Social Connections: Moderately Isolated   Frequency of Communication with Friends and Family: More than three times a week   Frequency of Social Gatherings with Friends and Family: Once a week   Attends Religious Services: 1 to 4 times per year   Active Member of [employer] or Organizations: No   Attends Archivist Meetings: Never   Marital Status: Never married   Outpatient Encounter Medications as of 06/13/2021  Medication Sig  acetaminophen (TYLENOL) 325 MG tablet Take 650 mg by mouth daily as needed for moderate pain or headache.   escitalopram (LEXAPRO) 10 MG tablet Take 1 tablet (10 mg total) by mouth daily.   levothyroxine (SYNTHROID) 88 MCG tablet Take 1 tablet (88 mcg total) by mouth daily before breakfast.   medroxyPROGESTERone (DEPO-PROVERA) 150 MG/ML injection Inject 1 mL (150 mg total) into the muscle every 3 (three) months.   nystatin cream (MYCOSTATIN) Apply 1 application topically 2 (two) times daily.   [DISCONTINUED] levothyroxine (SYNTHROID) 100 MCG tablet Take 1 tablet (100 mcg total) by mouth daily before breakfast.   No facility-administered encounter medications on file as of 06/13/2021.   ALLERGIES: No Known Allergies  VACCINATION STATUS: Immunization History  Administered Date(s) Administered   Influenza,inj,Quad PF,6+ Mos 07/25/2018    HPI Sherri Wiggins is 25 y.o. female who presents today with a medical history as above. she is  post near total thyroidectomy following abnormal FNA and Afirma molecular studies of asprate.   She is currently on levothyroxine 100 mcg p.o. daily before breakfast.  She reports compliance to her medication.  She has no new complaints.     Her previsit thyroid function tests are consistent with slight over replacement.  She has lost approximately 6 pounds since last visit.    She has suboptimal intelligence,  a poor historian, accompanied by her grandmother  today.  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  -After her surgery, her immediate post I-131 therapy whole body scan shows mild activity in the neck consistent with thyroid remnant, absent evidence of distant metastasis. - Her recent  thyroid/neck ultrasound is negative for any residual thyroid tissue or recurrence. -Her previsit Thyrogen stimulated whole-body scan did not show any tumor recurrence.  -She denies dysphagia, odynophagia, voice change.  She is status post corrective surgery for cleft lip and palate in 1999.    -Her grandmother reveals that 1 of the patient great aunt was diagnosed with thyroid cancer. -Thyroid function tests are consistent  with slight over replacement.   Review of Systems   Objective:    BP (!) 108/58   Pulse 60   Ht 5' 1.5" (1.562 m)   Wt 138 lb 3.2 oz (62.7 kg)   LMP 05/18/2021 (Within Days) Comment: Neg. Preg. Test 05-27-21  BMI 25.69 kg/m   Wt Readings from Last 3 Encounters:  06/13/21 138 lb 3.2 oz (62.7 kg)  06/02/21 137 lb 8 oz (62.4 kg)  04/13/21 139 lb (63 kg)    Physical Exam    CMP ( most recent) CMP     Component Value Date/Time   NA 140 09/20/2018 0854   K 3.9 09/20/2018 0854   CL 106 09/20/2018 0854   CO2 23 09/20/2018 0854   GLUCOSE 83 09/20/2018 0854   BUN 10 09/20/2018 0854   CREATININE 0.88 09/20/2018 0854   CALCIUM 9.3 09/20/2018 0854   PROT 8.2 (H) 09/20/2018 0854   ALBUMIN 4.3 09/20/2018 0854   AST 16 09/20/2018 0854   ALT 8  09/20/2018 0854   ALKPHOS 39 09/20/2018 0854   BILITOT 2.1 (H) 09/20/2018 0854   GFRNONAA >60 09/20/2018 0854   GFRAA >60 09/20/2018 0854   Recent Results (from the past 2160 hour(s))  POCT urine pregnancy     Status: None   Collection Time: 04/13/21  3:44 PM  Result Value  Ref Range   Preg Test, Ur Negative Negative  Pregnancy, urine     Status: None   Collection Time: 05/27/21  8:00 AM  Result Value Ref Range   Preg Test, Ur NEGATIVE NEGATIVE    Comment:        THE SENSITIVITY OF THIS METHODOLOGY IS >20 mIU/mL. Performed at [facility], [address], [place], [place] [address]   TSH     Status: Abnormal   Collection Time: 05/27/21  8:34 AM  Result Value Ref Range   TSH 178.640 (H) 0.350 - 4.500 uIU/mL    Comment: Performed by a 3rd Generation assay with a functional sensitivity of <=0.01 uIU/mL. Performed at [facility], [address], [place], [place] [address]   T4, free     Status: Abnormal   Collection Time: 05/27/21  8:34 AM  Result Value Ref Range   Free T4 1.63 (H) 0.61 - 1.12 ng/dL    Comment: (NOTE) Biotin ingestion may interfere with free T4 tests. If the results are inconsistent with the TSH level, previous test results, or the clinical presentation, then consider biotin interference. If needed, order repeat testing after stopping biotin. Performed at [facility], [place] [address] [place], [place] [address]   Thyroglobulin     Status: None   Collection Time: 05/27/21  8:37 AM  Result Value Ref Range   Thyroglobulin <2.0 ng/mL    Comment: (NOTE) This test was developed and its performance characteristics determined by [facility]. It has not been cleared or approved by the Food and Drug Administration. Reference Range: Pubertal Children and Adults: <40 According to the [facility] of Clinical Biochemistry, the reference interval for Thyroglobulin (TG) should be related to euthyroid patients and not for patients who underwent thyroidectomy.  TG  reference intervals for these patients depend on the residual mass of the thyroid tissue left after surgery.  Establishing a post-operative baseline is recommended.  The assay quantitation limit is 2.0 ng/mL. Performed At: [facility] [place], [place] [id number] Jake Bathe F MD [id number]   Thyroglobulin antibody     Status: None   Collection Time: 05/27/21  8:37 AM  Result Value Ref Range   Thyroglobulin Antibody <1.0 0.0 - 0.9 IU/mL    Comment: (NOTE) Thyroglobulin Antibody measured by [facility] Methodology Performed At: [facility] [place], [place] [id number] Rush Farmer MD [id number]    Thyroid/neck ultrasound from April 10, 2019: No regional cervical lymphadenopathy.  Post total thyroidectomy without evidence of residual or locally recurrent disease.   Assessment & Plan:   1.  Follicular variant papillary thyroid cancer- pT3a, pNx  -Surgical findings, completion of treatment and long-term surveillance have been discussed with both of her grandparents. -She is status post near total thyroidectomy,  as well as I-131 thyroid remnant ablation with whole-body scan-completed November 2019.  Her June 2020  neck ultrasound did not show any lymphadenopathy nor residual thyroid or recurrent disease. -Her  Thyrogen stimulated WBS is negative for tumor recurrence.      2.  Postsurgical hypothyroidism -Her previsit thyroid function tests are consistent with slight over replacement.  She is advised to lower levothyroxine to 88 mcg p.o. daily before breakfast.     - We discussed about the correct intake of her thyroid hormone, on empty stomach at fasting, with water, separated by at least 30 minutes from breakfast and other medications,  and separated by more than 4 hours from calcium, iron,  multivitamins, acid reflux medications (PPIs). -Patient is made aware of the fact that thyroid hormone replacement is needed for  life, dose to be adjusted by periodic monitoring of thyroid function tests.    She is advised to maintain close follow-up with her PMD Dr. Nevada Crane.   I spent 25 minutes in the care of the patient today including review of labs from Thyroid Function, CMP, and other relevant labs ; imaging/biopsy records (current and previous including abstractions from other facilities); face-to-face time discussing  her lab results and symptoms, medications doses, her options of short and long term treatment based on the latest standards of care / guidelines;   and documenting the encounter.  Sherri Wiggins  participated in the discussions, expressed understanding, and voiced agreement with the above plans.  All questions were answered to her satisfaction. she is encouraged to contact clinic should she have any questions or concerns prior to her return visit.   Follow up plan: Return in about 6 months (around 12/14/2021) for F/U with Pre-visit Labs.   Glade Lloyd, MD [facility] [address] [place], [place] [address] Phone: [phone]  Fax: [phone]     06/13/2021, 3:07 PM  This note was partially dictated with voice recognition software. Similar sounding words can be transcribed inadequately or may not  be corrected upon review.

## 2021-07-04 ENCOUNTER — Ambulatory Visit (INDEPENDENT_AMBULATORY_CARE_PROVIDER_SITE_OTHER): Payer: Medicaid Other

## 2021-07-04 ENCOUNTER — Other Ambulatory Visit: Payer: Self-pay

## 2021-07-04 DIAGNOSIS — Z3042 Encounter for surveillance of injectable contraceptive: Secondary | ICD-10-CM

## 2021-07-04 MED ORDER — MEDROXYPROGESTERONE ACETATE 150 MG/ML IM SUSP
150.0000 mg | Freq: Once | INTRAMUSCULAR | Status: AC
  Administered 2021-07-04: 150 mg via INTRAMUSCULAR

## 2021-07-04 NOTE — Progress Notes (Signed)
   NURSE VISIT- INJECTION  SUBJECTIVE:  Sherri Wiggins is a 24 y.o. G22P0010 female here for a Depo Provera for contraception/period management. She is a GYN patient.   OBJECTIVE:  There were no vitals taken for this visit.  Appears well, in no apparent distress  Injection administered in: Right deltoid  Meds ordered this encounter  Medications   medroxyPROGESTERone (DEPO-PROVERA) injection 150 mg    ASSESSMENT: GYN patient Depo Provera for contraception/period management PLAN: Follow-up: in 11-13 weeks for next Depo   Miaa Latterell A Jocilyn Trego  07/04/2021 1:47 PM

## 2021-09-26 ENCOUNTER — Ambulatory Visit (INDEPENDENT_AMBULATORY_CARE_PROVIDER_SITE_OTHER): Payer: Medicaid Other | Admitting: *Deleted

## 2021-09-26 ENCOUNTER — Other Ambulatory Visit: Payer: Self-pay

## 2021-09-26 DIAGNOSIS — Z3042 Encounter for surveillance of injectable contraceptive: Secondary | ICD-10-CM

## 2021-09-26 MED ORDER — MEDROXYPROGESTERONE ACETATE 150 MG/ML IM SUSP
150.0000 mg | Freq: Once | INTRAMUSCULAR | Status: AC
  Administered 2021-09-26: 150 mg via INTRAMUSCULAR

## 2021-09-26 NOTE — Progress Notes (Signed)
   NURSE VISIT- INJECTION  SUBJECTIVE:  Sherri Wiggins is a 25 y.o. G29P0010 female here for a Depo Provera for contraception/period management. She is a GYN patient.   OBJECTIVE:  There were no vitals taken for this visit.  Appears well, in no apparent distress  Injection administered in: Right deltoid  Meds ordered this encounter  Medications   medroxyPROGESTERone (DEPO-PROVERA) injection 150 mg    ASSESSMENT: GYN patient Depo Provera for contraception/period management PLAN: Follow-up: in 11-13 weeks for next Depo   Levy Pupa  09/26/2021 2:37 PM

## 2021-12-05 ENCOUNTER — Other Ambulatory Visit: Payer: Self-pay | Admitting: Women's Health

## 2021-12-08 ENCOUNTER — Other Ambulatory Visit (HOSPITAL_COMMUNITY)
Admission: RE | Admit: 2021-12-08 | Discharge: 2021-12-08 | Disposition: A | Discharge status: Home / Self Care | Payer: Medicaid Other | Source: Ambulatory Visit | Attending: "Endocrinology | Admitting: "Endocrinology

## 2021-12-08 ENCOUNTER — Other Ambulatory Visit: Payer: Self-pay | Admitting: Adult Health

## 2021-12-08 ENCOUNTER — Other Ambulatory Visit: Payer: Self-pay | Admitting: Women's Health

## 2021-12-08 ENCOUNTER — Other Ambulatory Visit: Payer: Self-pay

## 2021-12-08 DIAGNOSIS — E890 Postprocedural hypothyroidism: Secondary | ICD-10-CM | POA: Insufficient documentation

## 2021-12-08 DIAGNOSIS — C73 Malignant neoplasm of thyroid gland: Secondary | ICD-10-CM | POA: Diagnosis present

## 2021-12-08 LAB — T4, FREE: Free T4: 1.21 ng/dL — ABNORMAL HIGH (ref 0.61–1.12)

## 2021-12-08 LAB — TSH: TSH: 5.916 u[IU]/mL — ABNORMAL HIGH (ref 0.350–4.500)

## 2021-12-09 LAB — THYROID PEROXIDASE ANTIBODY: Thyroperoxidase Ab SerPl-aCnc: <9 IU/mL (ref 0–34)

## 2021-12-10 LAB — THYROGLOBULIN ANTIBODY: Thyroglobulin Antibody: <1 IU/mL (ref 0.0–0.9)

## 2021-12-14 ENCOUNTER — Encounter: Payer: Self-pay | Admitting: "Endocrinology

## 2021-12-14 ENCOUNTER — Ambulatory Visit (INDEPENDENT_AMBULATORY_CARE_PROVIDER_SITE_OTHER): Payer: Medicaid Other | Admitting: "Endocrinology

## 2021-12-14 ENCOUNTER — Other Ambulatory Visit: Payer: Self-pay

## 2021-12-14 VITALS — BP 98/66 | HR 64 | Ht 61.5 in | Wt 144.0 lb

## 2021-12-14 DIAGNOSIS — E890 Postprocedural hypothyroidism: Secondary | ICD-10-CM | POA: Diagnosis not present

## 2021-12-14 DIAGNOSIS — C73 Malignant neoplasm of thyroid gland: Secondary | ICD-10-CM | POA: Diagnosis not present

## 2021-12-14 NOTE — Progress Notes (Signed)
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 On no meds, no more seizures   Thyroid disease    enlarged   Webbed toes of both feet    Past Surgical History:  Procedure Laterality Date   BIOPSY THYROID     x 2   CATARACT EXTRACTION W/PHACO Bilateral 04/30/2017   Procedure: CATARACT EXTRACTION PHACO AND INTRAOCULAR LENS PLACEMENT ([place]);  Surgeon: Tonny Branch, MD;  Location: [facility];  Service: Ophthalmology;  Laterality: Bilateral;  LEFT EYE CDE: 0.46 RIGHT EYE CDE: 0.00   EYE SURGERY     laser   HERNIA REPAIR Right    RHINOPLASTY FOR CLEFT LIP / PALATE     STRABISMUS SURGERY     THYROIDECTOMY N/A 07/24/2018   Procedure: TOTAL THYROIDECTOMY;  Surgeon: Aviva Signs, MD;  Location: [facility];  Service: General;  Laterality: N/A;   TYMPANOSTOMY TUBE PLACEMENT     Social History   Socioeconomic History   Marital status: Single    Spouse name: Not on file   Number of children: Not on file   Years of education: Not on file   Highest education level: Not on file  Occupational History   Not on file  Tobacco Use   Smoking status: Never   Smokeless tobacco: Never  Vaping Use   Vaping Use: Never used  Substance and Sexual Activity   Alcohol use: Not Currently   Drug use: No   Sexual activity: Not Currently    Birth control/protection: Condom, Injection  Other Topics Concern   Not on file  Social History Narrative   Not on file   Social Determinants of Health   Financial Resource Strain: Low Risk    Difficulty of Paying Living Expenses: Not very hard  Food Insecurity: No Food Insecurity    Worried About Charity fundraiser in the Last Year: Never true   Ran Out of Food in the Last Year: Never true  Transportation Needs: No Transportation Needs   Lack of Transportation (Medical): No   Lack of Transportation (Non-Medical): No  Physical Activity: Insufficiently Active   Days of Exercise per Week: 3 days   Minutes of Exercise per Session: 20 min  Stress: No Stress Concern Present   Feeling of Stress : Only a little  Social Connections: Moderately Isolated   Frequency of Communication with Friends and Family: More than three times a week   Frequency of Social Gatherings with Friends and Family: Once a week   Attends Religious Services: 1 to 4 times per year   Active Member of [employer] or Organizations: No   Attends Archivist Meetings: Never   Marital Status: Never married   Outpatient Encounter Medications as of 12/14/2021  Medication Sig  acetaminophen (TYLENOL) 325 MG tablet Take 650 mg by mouth daily as needed for moderate pain or headache.   escitalopram (LEXAPRO) 10 MG tablet Take 1 tablet (10 mg total) by mouth daily.   levothyroxine (SYNTHROID) 88 MCG tablet Take 1 tablet (88 mcg total) by mouth daily before breakfast.   medroxyPROGESTERone (DEPO-PROVERA) 150 MG/ML injection Inject 1 mL (150 mg total) into the muscle every 3 (three) months.   nystatin cream (MYCOSTATIN) Apply 1 application topically 2 (two) times daily.   promethazine (PHENERGAN) 25 MG tablet TAKE 1/2 TO 1 TABLET(12.5 TO 25 MG) BY MOUTH EVERY 6 HOURS AS NEEDED FOR NAUSEA OR VOMITING   No facility-administered encounter medications on file as of 12/14/2021.   ALLERGIES: No Known Allergies  VACCINATION STATUS: Immunization History  Administered Date(s) Administered   Influenza,inj,Quad PF,6+ Mos 07/25/2018    HPI Sherri Wiggins is 26 y.o. female who presents today with a medical history as above. she is  post near total thyroidectomy following abnormal FNA and Afirma molecular studies of  asprate.  She is currently on levothyroxine 88 mcg p.o. daily before breakfast.  She reports compliance to her medication.  She has no new complaints.     Her previsit thyroid function tests are consistent with slight over replacement.  She has lost approximately 6 pounds since last visit.    She has suboptimal intelligence,  a poor historian, accompanied by her grandmother  today.  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  -After her surgery, her immediate post I-131 therapy whole body scan shows mild activity in the neck consistent with thyroid remnant, absent evidence of distant metastasis. - Her recent  thyroid/neck ultrasound is negative for any residual thyroid tissue or recurrence. -Her previsit Thyrogen stimulated whole-body scan did not show any tumor recurrence.  -She denies dysphagia, odynophagia, voice change.  She is status post corrective surgery for cleft lip and palate in 1999.    -Her grandmother reveals that 1 of the patient great aunt was diagnosed with thyroid cancer. -Thyroid function tests are consistent  with slight over replacement.   Review of Systems   Objective:    BP 98/66    Pulse 64    Ht 5' 1.5" (1.562 m)    Wt 144 lb (65.3 kg)    BMI 26.77 kg/m   Wt Readings from Last 3 Encounters:  12/14/21 144 lb (65.3 kg)  06/13/21 138 lb 3.2 oz (62.7 kg)  06/02/21 137 lb 8 oz (62.4 kg)    Physical Exam    CMP ( most recent) CMP     Component Value Date/Time   NA 140 09/20/2018 0854   K 3.9 09/20/2018 0854   CL 106 09/20/2018 0854   CO2 23 09/20/2018 0854   GLUCOSE 83 09/20/2018 0854   BUN 10 09/20/2018 0854   CREATININE 0.88 09/20/2018 0854   CALCIUM 9.3 09/20/2018 0854   PROT 8.2 (H) 09/20/2018 0854   ALBUMIN 4.3 09/20/2018 0854   AST 16 09/20/2018 0854   ALT 8 09/20/2018 0854   ALKPHOS 39 09/20/2018 0854   BILITOT 2.1 (H)  09/20/2018 0854   GFRNONAA >60 09/20/2018 0854   GFRAA >60 09/20/2018 0854   Recent Results (from the past 2160 hour(s))  TSH     Status: Abnormal   Collection Time: 12/08/21  3:18 PM  Result Value Ref Range  TSH 5.916 (H) 0.350 - 4.500 uIU/mL    Comment: Performed by a 3rd Generation assay with a functional sensitivity of <=0.01 uIU/mL. Performed at [facility], [address], [place], [place] [address]   T4, free     Status: Abnormal   Collection Time: 12/08/21  3:18 PM  Result Value Ref Range   Free T4 1.21 (H) 0.61 - 1.12 ng/dL    Comment: (NOTE) Biotin ingestion may interfere with free T4 tests. If the results are inconsistent with the TSH level, previous test results, or the clinical presentation, then consider biotin interference. If needed, order repeat testing after stopping biotin. Performed at [facility], [place] [address] [place], [place] [address]   Thyroglobulin antibody     Status: None   Collection Time: 12/08/21  3:19 PM  Result Value Ref Range   Thyroglobulin Antibody <1.0 0.0 - 0.9 IU/mL    Comment: (NOTE) Thyroglobulin Antibody measured by [facility] Methodology Performed At: [facility] [place], [place] [id number] Rush Farmer MD [id number]   Thyroid peroxidase antibody     Status: None   Collection Time: 12/08/21  3:19 PM  Result Value Ref Range   Thyroperoxidase Ab SerPl-aCnc <9 0 - 34 IU/mL    Comment: (NOTE) Performed At: [facility] [place], [place] [id number] Rush Farmer MD [id number]    Thyroid/neck ultrasound from April 10, 2019: No regional cervical lymphadenopathy.  Post total thyroidectomy without evidence of residual or locally recurrent disease.   Assessment & Plan:   1.  Follicular variant papillary thyroid cancer- pT3a, pNx  -Surgical findings, completion of treatment and long-term surveillance have been discussed with both  the patient and her grandma. -She  is status post near total thyroidectomy,  as well as I-131 thyroid remnant ablation with whole-body scan-completed November 2019.  Her June 2020  neck ultrasound did not show any lymphadenopathy nor residual thyroid or recurrent disease. -Her  Thyrogen stimulated WBS is negative for tumor recurrence.   She will be considered for repeat surveillance thyroid ultrasound after her next visit.    2.  Postsurgical hypothyroidism -Her previsit thyroid function tests are consistent with appropriate  replacement.  She is advised to continue levothyroxine 88 mcg p.o. daily before breakfast.     - We discussed about the correct intake of her thyroid hormone, on empty stomach at fasting, with water, separated by at least 30 minutes from breakfast and other medications,  and separated by more than 4 hours from calcium, iron, multivitamins, acid reflux medications (PPIs). -Patient is made aware of the fact that thyroid hormone replacement is needed for life, dose to be adjusted by periodic monitoring of thyroid function tests.   She is advised to maintain close follow-up with her PMD Dr. [place] Crane.   I spent 21 minutes in the care of the patient today including review of labs from Thyroid Function, CMP, and other relevant labs ; imaging/biopsy records (current and previous including abstractions from other facilities); face-to-face time discussing  her lab results and symptoms, medications doses, her options of short and long term treatment based on the latest standards of care / guidelines;   and documenting the encounter.  Milinda Antis  participated in the discussions, expressed understanding, and voiced agreement with the above plans.  All questions were answered to her satisfaction. she is encouraged to contact clinic should she have any questions or concerns prior to her return visit.   Follow up plan: Return  in about 6 months (around 06/13/2022) for F/U with Pre-visit Labs.   Glade Lloyd, MD [facility] [address] [place], [place] [address] Phone: [phone]  Fax: [phone]     12/14/2021, 7:16 PM  This note was partially dictated with voice recognition software. Similar sounding words can be transcribed inadequately or may not  be corrected upon review.

## 2021-12-15 ENCOUNTER — Ambulatory Visit: Payer: Medicaid Other

## 2021-12-19 ENCOUNTER — Ambulatory Visit: Payer: Medicaid Other

## 2021-12-22 ENCOUNTER — Other Ambulatory Visit: Payer: Self-pay

## 2021-12-22 ENCOUNTER — Ambulatory Visit (INDEPENDENT_AMBULATORY_CARE_PROVIDER_SITE_OTHER): Payer: Medicaid Other | Admitting: *Deleted

## 2021-12-22 DIAGNOSIS — Z3042 Encounter for surveillance of injectable contraceptive: Secondary | ICD-10-CM | POA: Diagnosis not present

## 2021-12-22 MED ORDER — MEDROXYPROGESTERONE ACETATE 150 MG/ML IM SUSP
150.0000 mg | Freq: Once | INTRAMUSCULAR | Status: AC
  Administered 2021-12-22: 150 mg via INTRAMUSCULAR

## 2021-12-22 NOTE — Progress Notes (Cosign Needed)
° °  NURSE VISIT- INJECTION  SUBJECTIVE:  Sherri Wiggins is a 26 y.o. G20P0010 female here for a Depo Provera for contraception/period management. She is a GYN patient.   OBJECTIVE:  There were no vitals taken for this visit.  Appears well, in no apparent distress  Injection administered in: Left deltoid  Meds ordered this encounter  Medications   medroxyPROGESTERone (DEPO-PROVERA) injection 150 mg    ASSESSMENT: GYN patient Depo Provera for contraception/period management PLAN: Follow-up: in 11-13 weeks for next Depo   Janece Canterbury  12/22/2021 12:42 PM

## 2022-01-05 ENCOUNTER — Other Ambulatory Visit: Payer: Self-pay | Admitting: "Endocrinology

## 2022-03-16 ENCOUNTER — Ambulatory Visit: Payer: Medicaid Other

## 2022-03-23 ENCOUNTER — Ambulatory Visit (INDEPENDENT_AMBULATORY_CARE_PROVIDER_SITE_OTHER): Payer: Medicaid Other | Admitting: *Deleted

## 2022-03-23 DIAGNOSIS — Z3042 Encounter for surveillance of injectable contraceptive: Secondary | ICD-10-CM

## 2022-03-23 MED ORDER — MEDROXYPROGESTERONE ACETATE 150 MG/ML IM SUSP
150.0000 mg | Freq: Once | INTRAMUSCULAR | Status: AC
  Administered 2022-03-23: 150 mg via INTRAMUSCULAR

## 2022-03-23 NOTE — Progress Notes (Signed)
   NURSE VISIT- INJECTION  SUBJECTIVE:  IYSIS Sherri Wiggins is a 26 y.o. G82P0010 female here for a Depo Provera for contraception/period management. She is a GYN patient. Pt is requesting a refill on Phenergan.   OBJECTIVE:  There were no vitals taken for this visit.  Appears well, in no apparent distress  Injection administered in: Right deltoid  Meds ordered this encounter  Medications   medroxyPROGESTERone (DEPO-PROVERA) injection 150 mg    ASSESSMENT: GYN patient Depo Provera for contraception/period management PLAN: Follow-up:  schedule pap/physical; may discuss other birth control options @ that time.     Sherri Sherri Wiggins  03/23/2022 3:37 PM

## 2022-05-15 ENCOUNTER — Other Ambulatory Visit: Payer: Self-pay | Admitting: Adult Health

## 2022-06-08 LAB — TSH: TSH: 3.26 (ref 0.41–5.90)

## 2022-06-13 ENCOUNTER — Encounter: Payer: Self-pay | Admitting: "Endocrinology

## 2022-06-13 ENCOUNTER — Ambulatory Visit (INDEPENDENT_AMBULATORY_CARE_PROVIDER_SITE_OTHER): Payer: Medicaid Other | Admitting: "Endocrinology

## 2022-06-13 VITALS — BP 100/64 | HR 64 | Ht 61.5 in | Wt 143.2 lb

## 2022-06-13 DIAGNOSIS — C73 Malignant neoplasm of thyroid gland: Secondary | ICD-10-CM

## 2022-06-13 DIAGNOSIS — E890 Postprocedural hypothyroidism: Secondary | ICD-10-CM | POA: Diagnosis not present

## 2022-06-13 NOTE — Progress Notes (Signed)
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 On no meds, no more seizures   Thyroid disease    enlarged   Webbed toes of both feet    Past Surgical History:  Procedure Laterality Date   BIOPSY THYROID     x 2   CATARACT EXTRACTION W/PHACO Bilateral 04/30/2017   Procedure: CATARACT EXTRACTION PHACO AND INTRAOCULAR LENS PLACEMENT ([place]);  Surgeon: Tonny Branch, MD;  Location: [facility];  Service: Ophthalmology;  Laterality: Bilateral;  LEFT EYE CDE: 0.46 RIGHT EYE CDE: 0.00   EYE SURGERY     laser   HERNIA REPAIR Right    RHINOPLASTY FOR CLEFT LIP / PALATE     STRABISMUS SURGERY     THYROIDECTOMY N/A 07/24/2018   Procedure: TOTAL THYROIDECTOMY;  Surgeon: Aviva Signs, MD;  Location: [facility];  Service: General;  Laterality: N/A;   TYMPANOSTOMY TUBE PLACEMENT     Social History   Socioeconomic History   Marital status: Single    Spouse name: Not on file   Number of children: Not on file   Years of education: Not on file   Highest education level: Not on file  Occupational History   Not on file  Tobacco Use   Smoking status: Never   Smokeless tobacco: Never  Vaping Use   Vaping Use: Never used  Substance and Sexual Activity   Alcohol use: Not Currently   Drug use: No   Sexual activity: Not Currently    Birth control/protection: Condom, Injection  Other Topics Concern   Not on file  Social History Narrative   Not on file   Social Determinants of Health   Financial Resource Strain: Low Risk  (06/02/2021)   Overall Financial Resource Strain (CARDIA)    Difficulty of Paying Living  Expenses: Not very hard  Food Insecurity: No Food Insecurity (06/02/2021)   Hunger Vital Sign    Worried About Running Out of Food in the Last Year: Never true    Ran Out of Food in the Last Year: Never true  Transportation Needs: No Transportation Needs (06/02/2021)   PRAPARE - Hydrologist (Medical): No    Lack of Transportation (Non-Medical): No  Physical Activity: Insufficiently Active (06/02/2021)   Exercise Vital Sign    Days of Exercise per Week: 3 days    Minutes of Exercise per Session: 20 min  Stress: No Stress Concern Present (06/02/2021)   [place]    Feeling of Stress : Only a little  Social Connections: Moderately Isolated (06/02/2021)   Social Connection and Isolation Panel [NHANES]    Frequency of Communication with Friends and Family: More than three times a week    Frequency  of Social Gatherings with Friends and Family: Once a week    Attends Religious Services: 1 to 4 times per year    Active Member of [employer] or Organizations: No    Attends Archivist Meetings: Never    Marital Status: Never married   Outpatient Encounter Medications as of 06/13/2022  Medication Sig   acetaminophen (TYLENOL) 325 MG tablet Take 650 mg by mouth daily as needed for moderate pain or headache.   escitalopram (LEXAPRO) 10 MG tablet TAKE 1 TABLET(10 MG) BY MOUTH DAILY   levothyroxine (SYNTHROID) 88 MCG tablet TAKE 1 TABLET(88 MCG) BY MOUTH DAILY BEFORE BREAKFAST   medroxyPROGESTERone (DEPO-PROVERA) 150 MG/ML injection Inject 1 mL (150 mg total) into the muscle every 3 (three) months.   nystatin cream (MYCOSTATIN) Apply 1 application topically 2 (two) times daily.   No facility-administered encounter medications on file as of 06/13/2022.   ALLERGIES: No Known Allergies  VACCINATION STATUS: Immunization History  Administered Date(s) Administered   Influenza,inj,Quad PF,6+ Mos 07/25/2018     HPI Sherri Wiggins is 26 y.o. female who presents today with a medical history as above. she is  post near total thyroidectomy following abnormal FNA and Afirma molecular studies of asprate.  She is currently on levothyroxine 88 mcg p.o. daily before breakfast.  She reports compliance to her medication.  She has no new complaints.  Her previsit thyroid function tests are consistent with appropriate replacement.   Her previsit thyroid function tests are consistent with slight over replacement.  She has lost approximately 6 pounds since last visit.    She has suboptimal intelligence,  a poor historian, accompanied by her grandmother  today.  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  -After her surgery, her immediate post I-131 therapy whole body scan shows mild activity in the neck consistent with thyroid remnant, absent evidence of distant metastasis. - Her recent  thyroid/neck ultrasound is negative for any residual thyroid tissue or recurrence. -In July 2022  Thyrogen stimulated whole-body scan did not show any tumor recurrence.  -She denies dysphagia, odynophagia, voice change.  She is status post corrective surgery for cleft lip and palate in 1999.    -Her grandmother reveals that 1 of the patient great aunt was diagnosed with thyroid cancer. -Thyroid function tests are consistent  with slight over replacement.   Review of Systems   Objective:    BP 100/64   Pulse 64   Ht 5' 1.5" (1.562 m)   Wt 143 lb 3.2 oz (65 kg)   BMI 26.62 kg/m   Wt Readings from Last 3 Encounters:  06/13/22 143 lb 3.2 oz (65 kg)  12/14/21 144 lb (65.3 kg)  06/13/21 138 lb 3.2 oz (62.7 kg)    Physical Exam    CMP ( most recent) CMP     Component Value Date/Time   NA 140 09/20/2018 0854   K 3.9 09/20/2018 0854   CL 106 09/20/2018 0854   CO2 23 09/20/2018 0854   GLUCOSE 83  09/20/2018 0854   BUN 10 09/20/2018 0854   CREATININE 0.88 09/20/2018 0854   CALCIUM 9.3 09/20/2018 0854   PROT 8.2 (H) 09/20/2018 0854   ALBUMIN 4.3 09/20/2018 0854   AST 16 09/20/2018 0854   ALT 8 09/20/2018 0854   ALKPHOS 39 09/20/2018 0854   BILITOT 2.1 (H) 09/20/2018 0854   GFRNONAA >60  09/20/2018 0854   GFRAA >60 09/20/2018 0854   Recent Results (from the past 2160 hour(s))  TSH     Status: None   Collection Time: 06/08/22 12:00 AM  Result Value Ref Range   TSH 3.26 0.41 - 5.90    Comment: T4,FREE 1.48   Thyroid/neck ultrasound from April 10, 2019: No regional cervical lymphadenopathy.  Post total thyroidectomy without evidence of residual or locally recurrent disease.   Assessment & Plan:   1.  Follicular variant papillary thyroid cancer- pT3a, pNx  -Surgical findings, completion of treatment and long-term surveillance have been discussed with both  the patient and her grandma. -She is status post near total thyroidectomy,  as well as I-131 thyroid remnant ablation with whole-body scan-completed November 2019.  Her June 2020  neck ultrasound did not show any lymphadenopathy nor residual thyroid or recurrent disease. -In July 2022,  Thyrogen stimulated WBS is negative for tumor recurrence.   She will be considered for repeat surveillance thyroid ultrasound before her next visit in 6 months.      2.  Postsurgical hypothyroidism -Her previsit thyroid function tests are consistent with appropriate replacement.  She is advised to continue levothyroxine 88 mcg p.o. daily before breakfast.     - We discussed about the correct intake of her thyroid hormone, on empty stomach at fasting, with water, separated by at least 30 minutes from breakfast and other medications,  and separated by more than 4 hours from calcium, iron, multivitamins, acid reflux medications (PPIs). -Patient is made aware of the fact that thyroid hormone replacement is needed for life, dose to be adjusted by  periodic monitoring of thyroid function tests.   She is advised to maintain close follow-up with her PMD Dr. Nevada Crane.   I spent 23 minutes in the care of the patient today including review of labs from Thyroid Function, CMP, and other relevant labs ; imaging/biopsy records (current and previous including abstractions from other facilities); face-to-face time discussing  her lab results and symptoms, medications doses, her options of short and long term treatment based on the latest standards of care / guidelines;   and documenting the encounter.  Sherri Wiggins and her grandmother participated in the discussions, expressed understanding, and voiced agreement with the above plans.  All questions were answered to her satisfaction. she is encouraged to contact clinic should she have any questions or concerns prior to her return visit.   Follow up plan: Return in about 6 months (around 12/14/2022) for F/U with Pre-visit Labs, Thyroid / Neck Ultrasound.   Glade Lloyd, MD [facility] [address] [place], [place] [address] Phone: [phone]  Fax: [phone]     06/13/2022, 2:41 PM  This note was partially dictated with voice recognition software. Similar sounding words can be transcribed inadequately or may not  be corrected upon review.

## 2022-06-15 ENCOUNTER — Encounter: Payer: Self-pay | Admitting: Adult Health

## 2022-06-15 ENCOUNTER — Other Ambulatory Visit: Payer: Self-pay | Admitting: Adult Health

## 2022-06-15 ENCOUNTER — Ambulatory Visit (INDEPENDENT_AMBULATORY_CARE_PROVIDER_SITE_OTHER): Payer: Medicaid Other | Admitting: Adult Health

## 2022-06-15 ENCOUNTER — Other Ambulatory Visit (HOSPITAL_COMMUNITY)
Admission: RE | Admit: 2022-06-15 | Discharge: 2022-06-15 | Disposition: A | Discharge status: Home / Self Care | Payer: Medicaid Other | Source: Ambulatory Visit | Attending: Adult Health | Admitting: Adult Health

## 2022-06-15 ENCOUNTER — Ambulatory Visit: Payer: Medicaid Other

## 2022-06-15 VITALS — BP 118/76 | HR 60 | Ht 61.5 in | Wt 145.0 lb

## 2022-06-15 DIAGNOSIS — Z3042 Encounter for surveillance of injectable contraceptive: Secondary | ICD-10-CM

## 2022-06-15 DIAGNOSIS — Z01419 Encounter for gynecological examination (general) (routine) without abnormal findings: Secondary | ICD-10-CM | POA: Insufficient documentation

## 2022-06-15 DIAGNOSIS — Z0000 Encounter for general adult medical examination without abnormal findings: Secondary | ICD-10-CM | POA: Diagnosis not present

## 2022-06-15 DIAGNOSIS — F419 Anxiety disorder, unspecified: Secondary | ICD-10-CM

## 2022-06-15 DIAGNOSIS — F32A Depression, unspecified: Secondary | ICD-10-CM | POA: Diagnosis not present

## 2022-06-15 MED ORDER — MEDROXYPROGESTERONE ACETATE 150 MG/ML IM SUSP: 150.0000 mg | Freq: Once | INTRAMUSCULAR | Status: DC

## 2022-06-15 NOTE — Progress Notes (Signed)
Patient ID: Sherri Wiggins, female   DOB: [birth year], 26 y.o.   MRN: [id number] History of Present Illness: Sherri Wiggins is a 26 year old female,single, G1P0010, in for a well woman gyn exam and pap. She depo is due before the 17th. PCP is Dr Nevada Crane.   Current Medications, Allergies, Past Medical History, Past Surgical History, Family History and Social History were reviewed in [employer] record.     Review of Systems:  Patient denies any headaches, hearing loss, fatigue, blurred vision, shortness of breath, chest pain, abdominal pain, problems with bowel movements, urination, or intercourse(not active). No joint pain or mood swings.    Physical Exam:BP 118/76 (BP Location: Right Arm, Patient Position: Sitting, Cuff Size: Normal)   Pulse 60   Ht 5' 1.5" (1.562 m)   Wt 145 lb (65.8 kg)   BMI 26.95 kg/m   General:  Well developed, well nourished, no acute distress Skin:  Warm and dry Neck:  Midline trachea, normal thyroid, good ROM, no lymphadenopathy Lungs; Clear to auscultation bilaterally Breast:  No dominant palpable mass, retraction, or nipple discharge Cardiovascular: Regular rate and rhythm Abdomen:  Soft, non tender, no hepatosplenomegaly Pelvic:  External genitalia is normal in appearance, no lesions.  The vagina is normal in appearance. Urethra has no lesions or masses. The cervix is smooth, pap with HR HPV genotyping and GC.CHL performed.   Uterus is felt to be normal size, shape, and contour.  No adnexal masses or tenderness noted.Bladder is non tender, no masses felt. Extremities/musculoskeletal:  No swelling or varicosities noted, no clubbing or cyanosis Psych:  No mood changes, alert and cooperative,seems happy AA is 1 Fall risk is low    06/15/2022    2:35 PM 06/02/2021    2:44 PM 04/13/2021    3:25 PM  Depression screen PHQ 2/9  Decreased Interest 0 0 0  Down, Depressed, Hopeless '1 1 1  '$ PHQ - 2 Score '1 1 1  '$ Altered sleeping 0 1   Tired, decreased  energy 1 0   Change in appetite 1 0   Feeling bad or failure about yourself  0 0   Trouble concentrating 1 1   Moving slowly or fidgety/restless 0 1   Suicidal thoughts 0 0   PHQ-9 Score 4 4        06/15/2022    2:36 PM 06/02/2021    2:46 PM 06/01/2020    9:47 AM  GAD 7 : Generalized Anxiety Score  Nervous, Anxious, on Edge 0 0 1  Control/stop worrying '1 1 2  '$ Worry too much - different things '2 1 3  '$ Trouble relaxing 0 0 1  Restless 1 0 0  Easily annoyed or irritable 0 1 0  Afraid - awful might happen '1 1 1  '$ Total GAD 7 Score '5 4 8  '$ Anxiety Difficulty   Not difficult at all      Upstream - 06/15/22 1435       Pregnancy Intention Screening   Does the patient want to become pregnant in the next year? No    Does the patient's partner want to become pregnant in the next year? No    Would the patient like to discuss contraceptive options today? No      Contraception Wrap Up   Current Method Hormonal Injection    End Method Hormonal Injection    Contraception Counseling Provided No            Examination chaperoned by Celene Squibb LPN  Impression and Plan: 1. Encounter for gynecological examination with Papanicolaou smear of cervix Pap sent Physical in 1 year Pap in 3 years if normal   2. Encounter for surveillance of injectable contraceptive Has refills on depo To come back Monday for depo  3. Anxiety and depression On Lexapro, has refills

## 2022-06-19 ENCOUNTER — Ambulatory Visit (INDEPENDENT_AMBULATORY_CARE_PROVIDER_SITE_OTHER): Payer: Medicaid Other | Admitting: *Deleted

## 2022-06-19 ENCOUNTER — Ambulatory Visit (HOSPITAL_COMMUNITY)
Admission: RE | Admit: 2022-06-19 | Discharge: 2022-06-19 | Disposition: A | Discharge status: Home / Self Care | Payer: Medicaid Other | Source: Ambulatory Visit | Attending: "Endocrinology | Admitting: "Endocrinology

## 2022-06-19 DIAGNOSIS — Z3042 Encounter for surveillance of injectable contraceptive: Secondary | ICD-10-CM | POA: Diagnosis not present

## 2022-06-19 DIAGNOSIS — E890 Postprocedural hypothyroidism: Secondary | ICD-10-CM | POA: Diagnosis not present

## 2022-06-19 MED ORDER — MEDROXYPROGESTERONE ACETATE 150 MG/ML IM SUSP
150.0000 mg | Freq: Once | INTRAMUSCULAR | Status: AC
  Administered 2022-06-19: 150 mg via INTRAMUSCULAR

## 2022-06-19 NOTE — Progress Notes (Signed)
   NURSE VISIT- INJECTION  SUBJECTIVE:  Sherri Wiggins is a 26 y.o. G73P0010 female here for a Depo Provera for contraception/period management. She is a GYN patient.   OBJECTIVE:  There were no vitals taken for this visit.  Appears well, in no apparent distress  Injection administered in: Left deltoid  Meds ordered this encounter  Medications   medroxyPROGESTERone (DEPO-PROVERA) injection 150 mg    ASSESSMENT: GYN patient Depo Provera for contraception/period management PLAN: Follow-up: in 11-13 weeks for next Depo   Levy Pupa  06/19/2022 3:12 PM

## 2022-06-21 LAB — CYTOLOGY - PAP
Chlamydia: NEGATIVE
Comment: NEGATIVE
Comment: NEGATIVE
Comment: NORMAL
Diagnosis: NEGATIVE
Diagnosis: REACTIVE
High risk HPV: NEGATIVE
Neisseria Gonorrhea: NEGATIVE

## 2022-08-04 ENCOUNTER — Other Ambulatory Visit: Payer: Self-pay | Admitting: "Endocrinology

## 2022-09-07 ENCOUNTER — Ambulatory Visit: Payer: Medicaid Other

## 2022-10-19 IMAGING — NM NM [ID] THYROID CANCER METS WHOLE BODY W/ THYROGEN
4 series · 4 of 4 positions shown · non-contrast
Comparison: Prior study 09/30/2018

CLINICAL DATA: History of thyroid cancer.

EXAM:
THYROGEN-STIMULATED A-YRY WHOLE BODY SCAN
TECHNIQUE: The patient received 0.9 mg Thyrogen intramuscularly every 24 hours
for two doses. On the third day the patient returned and received
the radiopharmaceutical, per orally. On the fifth day, the patient
returned and whole body planar images were obtained in the anterior
and posterior projections.
RADIOPHARMACEUTICALS:  4.22 mCi A-YRY sodium iodide orally

[Series 1: i131 whole body · 2.66mm/px · 1 of 1 slices shown (1 of 2)]
[im 1/1  full-range]
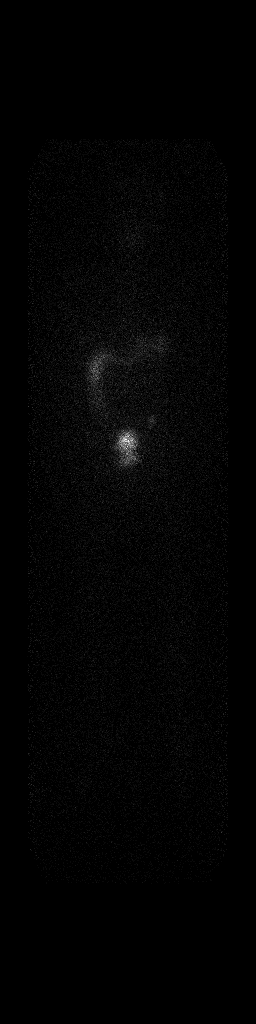

[Series 1: i131 whole body · 2.66mm/px · 1 of 1 slices shown (2 of 2)]
[im 1/1  full-range]
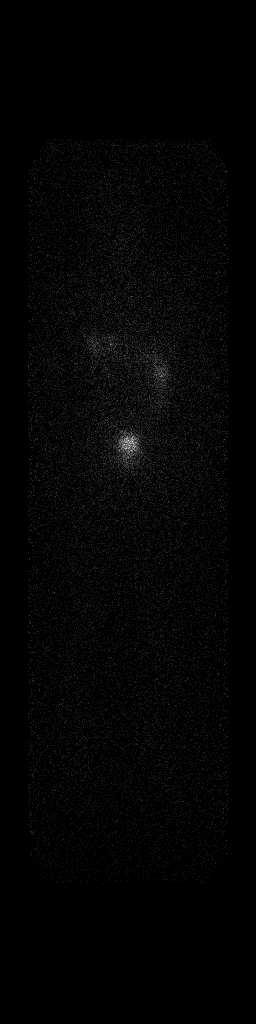

[Series 2: static with marker · 4.14mm/px · 1 of 1 slices shown]
[im 1/1  full-range]
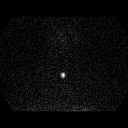

[Series 3: static without marker · non-contrast · 4.14mm/px · 1 of 1 slices shown]
[im 1/1  full-range]
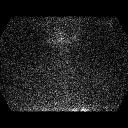

[4 of 4 positions shown; findings below may reference images not displayed]

FINDINGS: No areas of abnormal uptake are noted in the neck to suggest
recurrent thyroid cancer/adenopathy.

Normal uptake noted in the colon and bladder. No findings for
metastatic disease involving the chest, abdomen or pelvis.
IMPRESSION: No findings for recurrent or metastatic thyroid cancer.

## 2022-10-27 IMAGING — US US BREAST*R* LIMITED INC AXILLA
1 series · 7 of 7 positions shown · non-contrast
Comparison: None.

CLINICAL DATA: Pea-sized lump in the UPPER-OUTER QUADRANT of the
RIGHT breast, detected by patient.

EXAM:
ULTRASOUND OF THE RIGHT BREAST

[Series 1: us breast*right* limited inc axilla · 0.07mm/px · 7 of 7 slices shown]
[im 1/7]
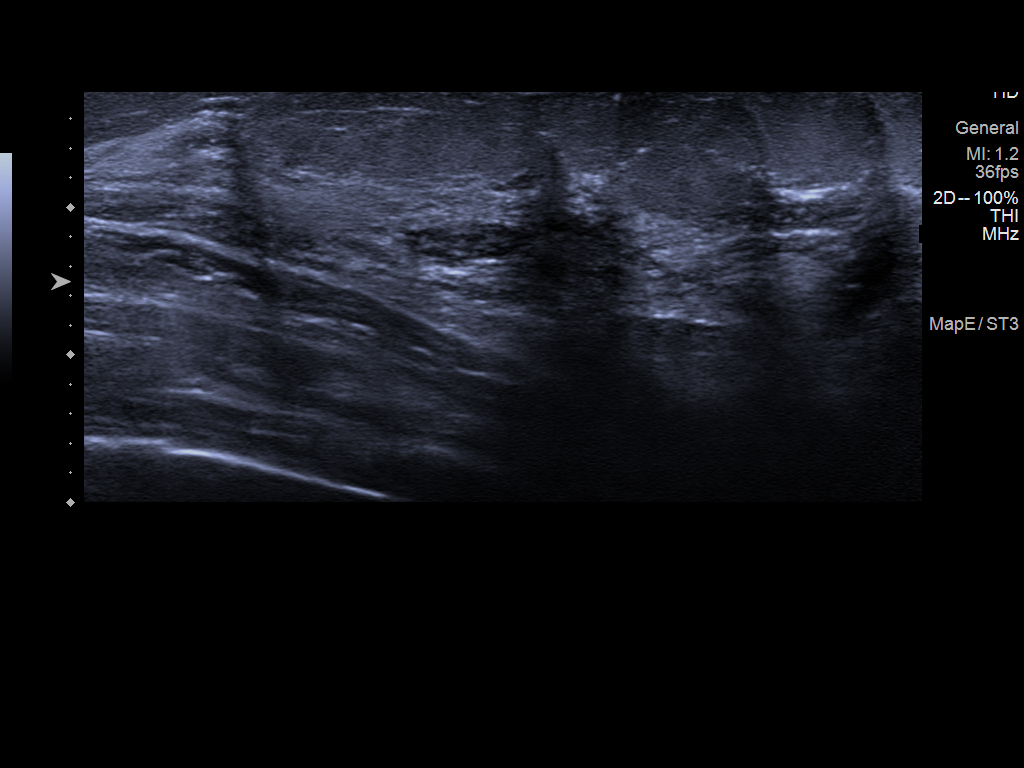
[im 2/7]
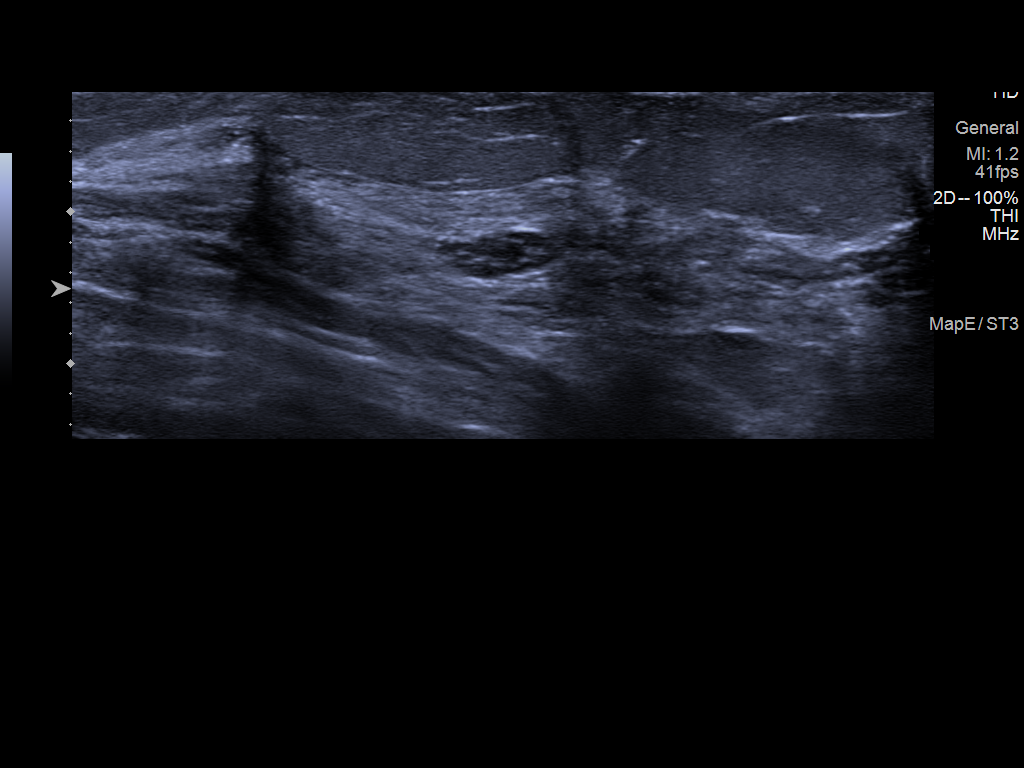
[im 3/7]
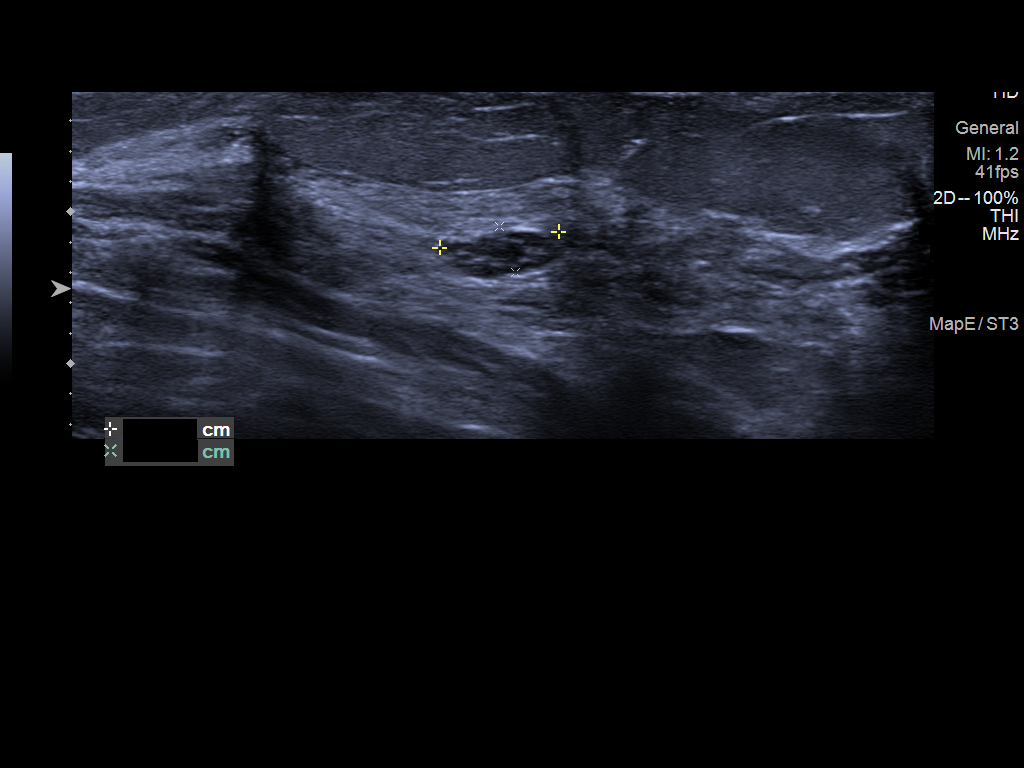
[im 4/7]
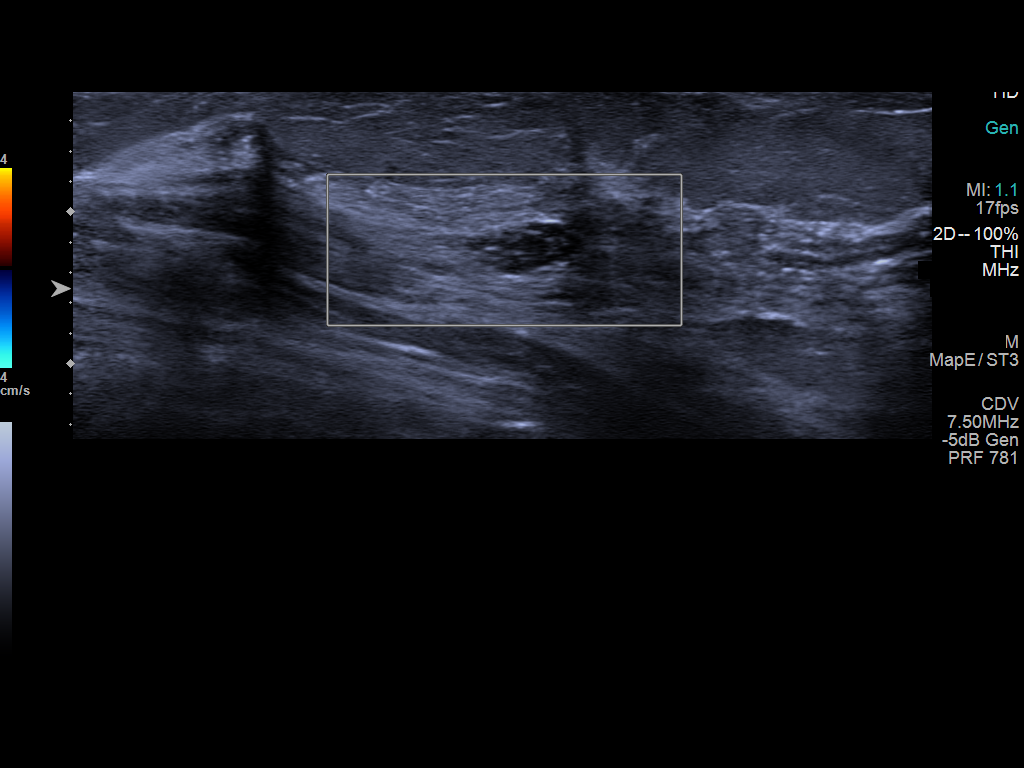
[im 5/7]
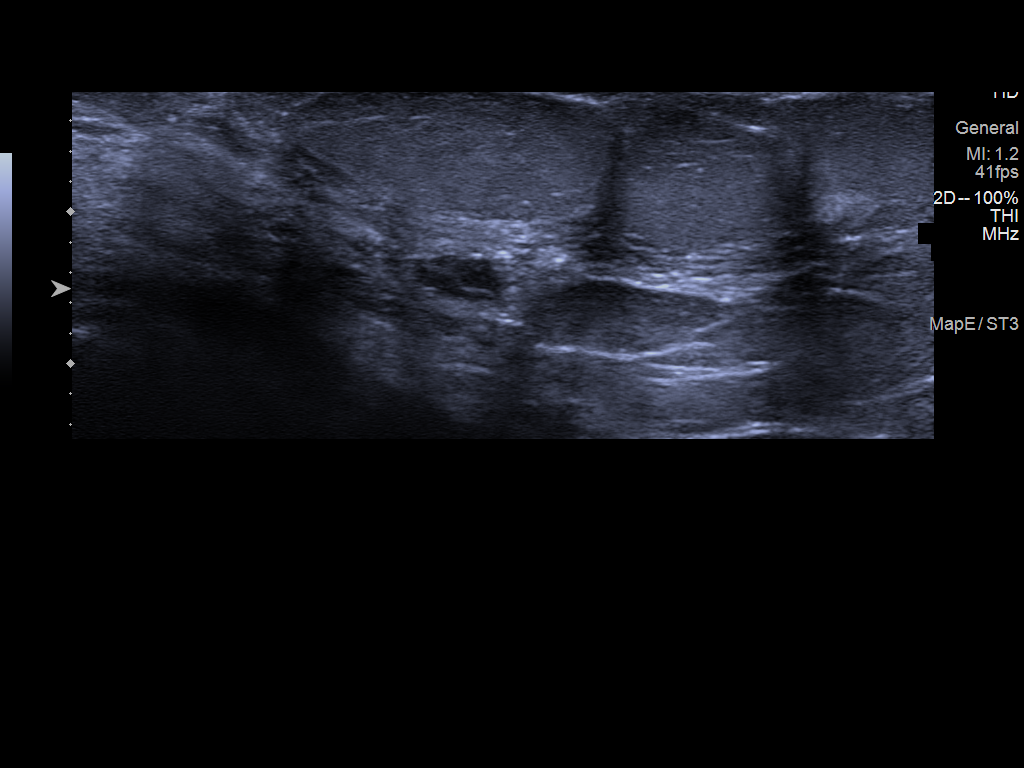
[im 6/7]
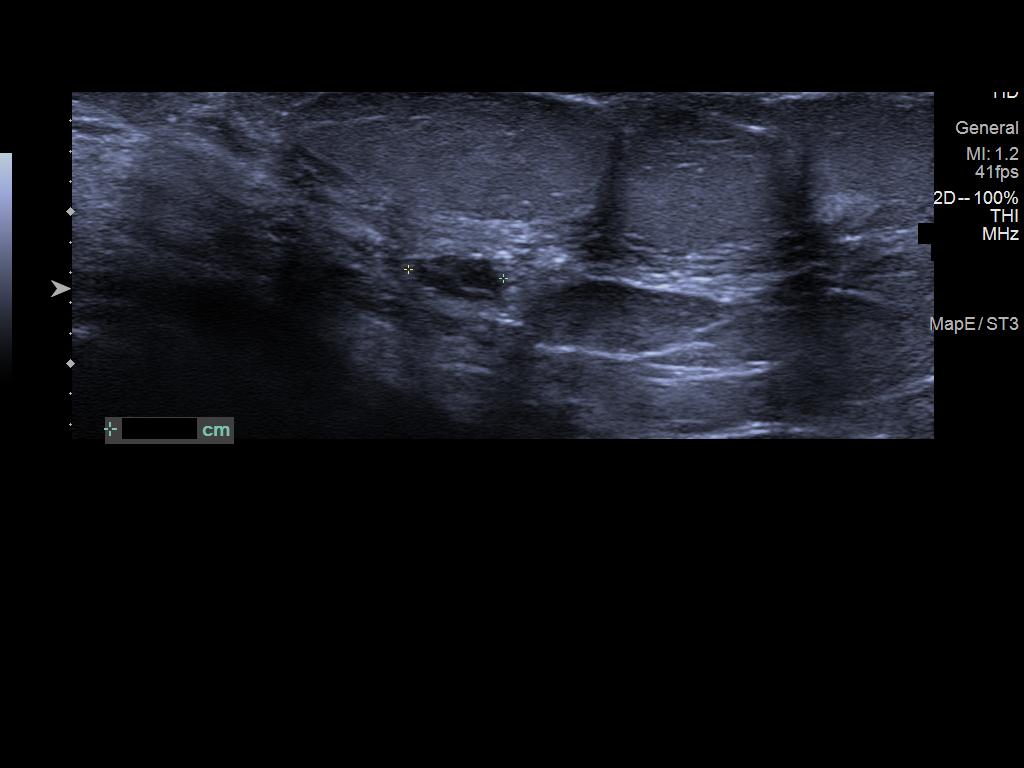
[im 7/7]
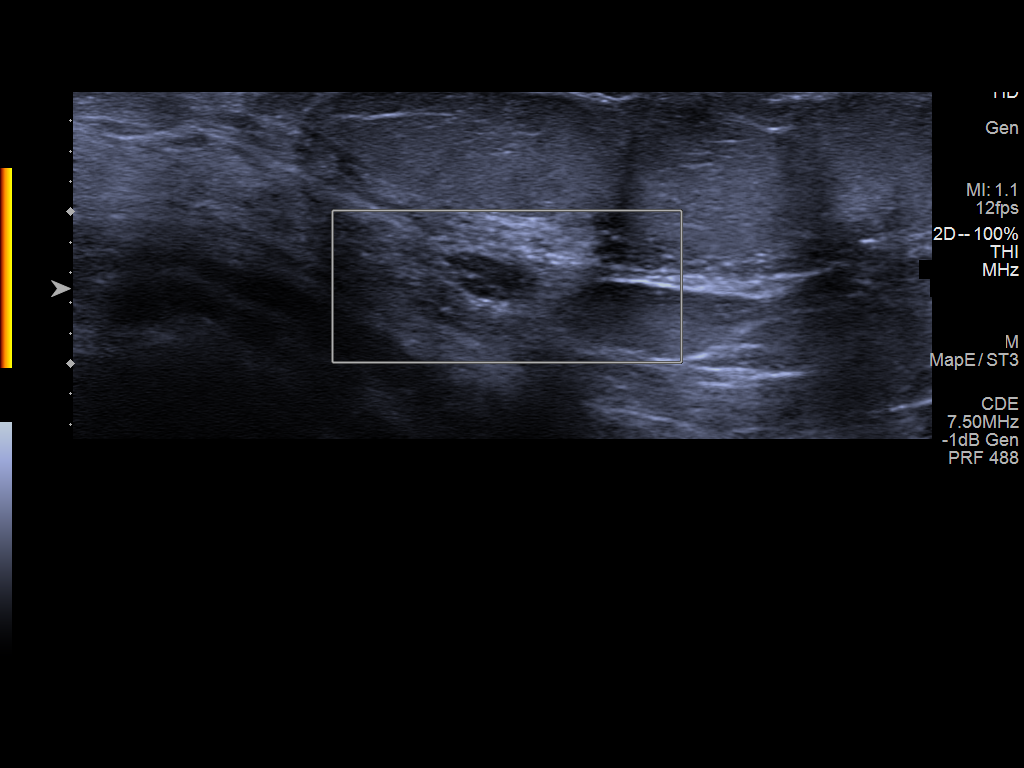

[7 of 7 positions shown; findings below may reference images not displayed]

FINDINGS: On physical exam, I palpate soft nodule in the 10 o'clock location
of the RIGHT breast, in the area of patient's concern. In the 9-10
o'clock location of the RIGHT breast, patient has well-healed scars
from previous chest tubes.

Targeted ultrasound is performed, showing a circumscribed oval
hypoechoic mass in the 10 o'clock location of the RIGHT breast 5
centimeters from the nipple measuring 0.8 x 0.3 x 0.6 centimeters.
There is no internal blood flow on Doppler evaluation.
IMPRESSION: Benign-appearing mass in the 10 o'clock location of the RIGHT
breast. We discussed management options including excision, biopsy,
and close follow-up. Imaging followup is recommended at 6, 12, and
24 months to assess stability. The patient and her mother concur
with this plan.

RECOMMENDATION:
Recommend RIGHT breast ultrasound in 6 months.

I have discussed the findings and recommendations with the patient
and her mother. If applicable, a reminder letter will be sent to the
patient regarding the next appointment.

BI-RADS CATEGORY  3: Probably benign.

## 2022-12-12 ENCOUNTER — Other Ambulatory Visit: Payer: Self-pay | Admitting: "Endocrinology

## 2022-12-14 ENCOUNTER — Ambulatory Visit: Payer: Medicaid Other | Admitting: "Endocrinology

## 2023-01-15 ENCOUNTER — Other Ambulatory Visit: Payer: Self-pay

## 2023-01-15 DIAGNOSIS — E890 Postprocedural hypothyroidism: Secondary | ICD-10-CM

## 2023-01-16 ENCOUNTER — Other Ambulatory Visit (HOSPITAL_COMMUNITY)
Admission: RE | Admit: 2023-01-16 | Discharge: 2023-01-16 | Disposition: A | Discharge status: Home / Self Care | Payer: 59 | Source: Ambulatory Visit | Attending: "Endocrinology | Admitting: "Endocrinology

## 2023-01-16 DIAGNOSIS — E890 Postprocedural hypothyroidism: Secondary | ICD-10-CM | POA: Diagnosis present

## 2023-01-16 LAB — TSH: TSH: 8.116 u[IU]/mL — ABNORMAL HIGH (ref 0.350–4.500)

## 2023-01-16 LAB — T4, FREE: Free T4: 0.88 ng/dL (ref 0.61–1.12)

## 2023-01-19 LAB — THYROGLOBULIN ANTIBODY: Thyroglobulin Antibody: <1 IU/mL (ref 0.0–0.9)

## 2023-01-22 ENCOUNTER — Ambulatory Visit (INDEPENDENT_AMBULATORY_CARE_PROVIDER_SITE_OTHER): Payer: 59 | Admitting: "Endocrinology

## 2023-01-22 ENCOUNTER — Encounter: Payer: Self-pay | Admitting: "Endocrinology

## 2023-01-22 VITALS — BP 96/58 | HR 68 | Ht 61.5 in | Wt 146.8 lb

## 2023-01-22 DIAGNOSIS — E890 Postprocedural hypothyroidism: Secondary | ICD-10-CM

## 2023-01-22 DIAGNOSIS — C73 Malignant neoplasm of thyroid gland: Secondary | ICD-10-CM

## 2023-01-22 MED ORDER — LEVOTHYROXINE SODIUM 100 MCG PO TABS: 100.0000 ug | ORAL_TABLET | Freq: Every day | ORAL | 1 refills | Status: DC

## 2023-01-22 NOTE — Progress Notes (Signed)
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 On no meds, no more seizures   Thyroid disease    enlarged   Webbed toes of both feet    Past Surgical History:  Procedure Laterality Date   BIOPSY THYROID     x 2   CATARACT EXTRACTION W/PHACO Bilateral 04/30/2017   Procedure: CATARACT EXTRACTION PHACO AND INTRAOCULAR LENS PLACEMENT ([place]);  Surgeon: Tonny Branch, MD;  Location: [facility];  Service: Ophthalmology;  Laterality: Bilateral;  LEFT EYE CDE: 0.46 RIGHT EYE CDE: 0.00   EYE SURGERY     laser   HERNIA REPAIR Right    RHINOPLASTY FOR CLEFT LIP / PALATE     STRABISMUS SURGERY     THYROIDECTOMY N/A 07/24/2018   Procedure: TOTAL THYROIDECTOMY;  Surgeon: Aviva Signs, MD;  Location: [facility];  Service: General;  Laterality: N/A;   TYMPANOSTOMY TUBE PLACEMENT     Social History   Socioeconomic History   Marital status: Single    Spouse name: Not on file   Number of children: Not on file   Years of education: Not on file   Highest education level: Not on file  Occupational History   Not on file  Tobacco Use   Smoking status: Never   Smokeless tobacco: Never  Vaping Use   Vaping Use: Never used  Substance and Sexual Activity   Alcohol use: Not Currently   Drug use: No   Sexual activity: Not Currently    Birth control/protection: Condom, Injection  Other Topics Concern   Not on file  Social History Narrative   Not on file   Social Determinants of Health   Financial Resource Strain: Low Risk  (06/15/2022)   Overall Financial Resource Strain (CARDIA)    Difficulty of Paying Living  Expenses: Not hard at all  Food Insecurity: No Food Insecurity (06/15/2022)   Hunger Vital Sign    Worried About Running Out of Food in the Last Year: Never true    Ran Out of Food in the Last Year: Never true  Transportation Needs: No Transportation Needs (06/15/2022)   PRAPARE - Hydrologist (Medical): No    Lack of Transportation (Non-Medical): No  Physical Activity: Insufficiently Active (06/15/2022)   Exercise Vital Sign    Days of Exercise per Week: 2 days    Minutes of Exercise per Session: 10 min  Stress: No Stress Concern Present (06/15/2022)   [place]    Feeling of Stress : Only a little  Social Connections: Socially Isolated (06/15/2022)   Social Connection and Isolation Panel [NHANES]    Frequency of Communication with Friends and Family: Twice a week    Frequency of Social  Gatherings with Friends and Family: Once a week    Attends Religious Services: Never    Marine scientist or Organizations: No    Attends Archivist Meetings: Never    Marital Status: Never married   Outpatient Encounter Medications as of 01/22/2023  Medication Sig   acetaminophen (TYLENOL) 325 MG tablet Take 650 mg by mouth daily as needed for moderate pain or headache.   escitalopram (LEXAPRO) 10 MG tablet TAKE 1 TABLET(10 MG) BY MOUTH DAILY   levothyroxine (SYNTHROID) 100 MCG tablet Take 1 tablet (100 mcg total) by mouth daily before breakfast.   medroxyPROGESTERone (DEPO-PROVERA) 150 MG/ML injection ADMINISTER 1 ML(150 MG) IN THE MUSCLE EVERY 3 MONTHS   nystatin cream (MYCOSTATIN) Apply 1 application topically 2 (two) times daily. (Patient taking differently: Apply 1 application  topically.)   [DISCONTINUED] levothyroxine (SYNTHROID) 88 MCG tablet TAKE 1 TABLET(88 MCG) BY MOUTH DAILY BEFORE BREAKFAST   No facility-administered encounter medications on file as of 01/22/2023.   ALLERGIES: No  Known Allergies  VACCINATION STATUS: Immunization History  Administered Date(s) Administered   Influenza,inj,Quad PF,6+ Mos 07/25/2018    HPI Sherri Wiggins is 27 y.o. female who presents today with a medical history as above. she is  post near total thyroidectomy following abnormal FNA and Afirma molecular studies of asprate.  She is currently on levothyroxine 88 mcg p.o. daily before breakfast.  She reports compliance to her medication.  She has no new complaints.     Her previsit thyroid function tests are consistent with slight under replacement.   She reports compliance and consistency taking her medication. She has suboptimal intelligence,  a poor historian, accompanied by her grandmother  today.  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  -After her surgery, her immediate post I-131 therapy whole body scan shows mild activity in the neck consistent with thyroid remnant, absent evidence of distant metastasis. - Her recent  thyroid/neck ultrasound is negative for any residual thyroid tissue or recurrence.  This was done in August 2023. -In July 2022  Thyrogen stimulated whole-body scan did not show any tumor recurrence.  -She denies dysphagia, odynophagia, voice change.  She is status post corrective surgery for cleft lip and palate in 1999.    -Her grandmother reveals that 1 of the patient great aunt was diagnosed with thyroid cancer. -Thyroid function tests are consistent  with slight over replacement.   Review of Systems   Objective:    BP (!) 96/58   Pulse 68   Ht 5' 1.5" (1.562 m)   Wt 146 lb 12.8 oz (66.6 kg)   BMI 27.29 kg/m   Wt Readings from Last 3 Encounters:  01/22/23 146 lb 12.8 oz (66.6 kg)  06/15/22 145 lb (65.8 kg)  06/13/22 143 lb 3.2 oz (65 kg)    Physical Exam    CMP ( most recent) CMP     Component Value Date/Time   NA 140  09/20/2018 0854   K 3.9 09/20/2018 0854   CL 106 09/20/2018 0854   CO2 23 09/20/2018 0854   GLUCOSE 83 09/20/2018 0854   BUN 10 09/20/2018 0854   CREATININE 0.88 09/20/2018 0854   CALCIUM 9.3 09/20/2018 0854   PROT 8.2 (H) 09/20/2018 0854   ALBUMIN 4.3 09/20/2018 0854   AST 16 09/20/2018 0854   ALT 8 09/20/2018 0854   ALKPHOS 39  09/20/2018 0854   BILITOT 2.1 (H) 09/20/2018 0854   GFRNONAA >60 09/20/2018 0854   GFRAA >60 09/20/2018 0854   Recent Results (from the past 2160 hour(s))  TSH     Status: Abnormal   Collection Time: 01/16/23  1:28 PM  Result Value Ref Range   TSH 8.116 (H) 0.350 - 4.500 uIU/mL    Comment: Performed by a 3rd Generation assay with a functional sensitivity of <=0.01 uIU/mL. Performed at [facility], [address] [place], [place] [address]   T4, free     Status: None   Collection Time: 01/16/23  1:37 PM  Result Value Ref Range   Free T4 0.88 0.61 - 1.12 ng/dL    Comment: (NOTE) Biotin ingestion may interfere with free T4 tests. If the results are inconsistent with the TSH level, previous test results, or the clinical presentation, then consider biotin interference. If needed, order repeat testing after stopping biotin. Performed at [facility], [place] [address], [place], [place] [address]   Thyroglobulin antibody     Status: None   Collection Time: 01/16/23  1:37 PM  Result Value Ref Range   Thyroglobulin Antibody <1.0 0.0 - 0.9 IU/mL    Comment: (NOTE) Thyroglobulin Antibody measured by [employer] Methodology It should be noted that the presence of thyroglobulin antibodies may not be pathogenic nor diagnostic, especially at very low levels. The assay manufacturer has found that four percent of individuals without evidence of thyroid disease or autoimmunity will have positive TgAb levels up to 4 IU/mL. Performed At: [facility] [place], [place] [id number] Rush Farmer MD [id number]    Thyroid/neck  ultrasound from April 10, 2019: No regional cervical lymphadenopathy.  Post total thyroidectomy without evidence of residual or locally recurrent disease.   Assessment & Plan:   1.  Follicular variant papillary thyroid cancer- pT3a, pNx  -Surgical findings, completion of treatment and long-term surveillance have been discussed with both  the patient and her grandma. -She is status post near total thyroidectomy,  as well as I-131 thyroid remnant ablation with whole-body scan-completed November 2019.  Her June 2020  neck ultrasound did not show any lymphadenopathy nor residual thyroid or recurrent disease. -In July 2022,  Thyrogen stimulated WBS is negative for tumor recurrence. Her most recent thyroid/neck ultrasound in August 2023 showed unremarkable findings.   2.  Postsurgical hypothyroidism -Her previsit thyroid function tests are consistent with under replacement.  I discussed and increase her levothyroxine to 100 mcg p.o. daily before breakfast.     - We discussed about the correct intake of her thyroid hormone, on empty stomach at fasting, with water, separated by at least 30 minutes from breakfast and other medications,  and separated by more than 4 hours from calcium, iron, multivitamins, acid reflux medications (PPIs). -Patient is made aware of the fact that thyroid hormone replacement is needed for life, dose to be adjusted by periodic monitoring of thyroid function tests.   She is advised to maintain close follow-up with her PMD Dr. Nevada Crane.   I spent  22  minutes in the care of the patient today including review of labs from Thyroid Function, CMP, and other relevant labs ; imaging/biopsy records (current and previous including abstractions from other facilities); face-to-face time discussing  her lab results and symptoms, medications doses, her options of short and long term treatment based on the latest standards of care / guidelines;   and documenting the encounter.  Milinda Antis  participated in the discussions, expressed understanding, and voiced agreement with the above plans.  All questions were answered to her satisfaction. she is encouraged to contact clinic should she have any questions or concerns prior to her return visit.    Follow up plan: Return in about 6 months (around 07/25/2023) for Fasting Labs  in AM B4 8.   Glade Lloyd, MD [facility] [address] [place], [place] [address] Phone: [phone]  Fax: [phone]     01/22/2023, 3:10 PM  This note was partially dictated with voice recognition software. Similar sounding words can be transcribed inadequately or may not  be corrected upon review.

## 2023-02-24 ENCOUNTER — Other Ambulatory Visit: Payer: Self-pay | Admitting: Adult Health

## 2023-05-15 ENCOUNTER — Ambulatory Visit (INDEPENDENT_AMBULATORY_CARE_PROVIDER_SITE_OTHER): Payer: 59 | Admitting: Adult Health

## 2023-05-15 ENCOUNTER — Encounter: Payer: Self-pay | Admitting: Adult Health

## 2023-05-15 VITALS — BP 112/66 | HR 56 | Ht 61.0 in | Wt 153.0 lb

## 2023-05-15 DIAGNOSIS — Z30013 Encounter for initial prescription of injectable contraceptive: Secondary | ICD-10-CM | POA: Insufficient documentation

## 2023-05-15 DIAGNOSIS — Z3202 Encounter for pregnancy test, result negative: Secondary | ICD-10-CM | POA: Diagnosis not present

## 2023-05-15 LAB — POCT URINE PREGNANCY: Preg Test, Ur: NEGATIVE

## 2023-05-15 MED ORDER — MEDROXYPROGESTERONE ACETATE 150 MG/ML IM SUSY
150.0000 mg | PREFILLED_SYRINGE | Freq: Once | INTRAMUSCULAR | Status: AC
  Administered 2023-05-15: 150 mg via INTRAMUSCULAR

## 2023-05-15 MED ORDER — MEDROXYPROGESTERONE ACETATE 150 MG/ML IM SUSP: 150.0000 mg | INTRAMUSCULAR | 4 refills | Status: DC

## 2023-05-15 NOTE — Progress Notes (Signed)
  Subjective:     Patient ID: Sherri Wiggins, female   DOB: [birth year], 27 y.o.   MRN: [id number]  HPI Sherri Wiggins is a 27 year old female, single, G1P0010, in wanting to get back on depo. Has not had sex recently, and has had depo in the past.     Component Value Date/Time   DIAGPAP  06/15/2022 1432    - Negative for Intraepithelial Lesions or Malignancy (NILM)   DIAGPAP - Benign reactive/reparative changes 06/15/2022 1432   DIAGPAP  04/21/2019 0000    NEGATIVE FOR INTRAEPITHELIAL LESIONS OR MALIGNANCY.   HPVHIGH Negative 06/15/2022 1432   ADEQPAP  06/15/2022 1432    Satisfactory for evaluation; transformation zone component PRESENT.   ADEQPAP  04/21/2019 0000    Satisfactory for evaluation  endocervical/transformation zone component PRESENT.   ADEQPAP (A) 04/02/2018 0000    Satisfactory for evaluation  endocervical/transformation zone component ABSENT.   PCP is Dr Margo Aye.  Review of Systems Periods regular Has occasional  headache Not having sex Reviewed past medical,surgical, social and family history. Reviewed medications and allergies.     Objective:   Physical Exam BP 112/66 (BP Location: Left Arm, Patient Position: Sitting, Cuff Size: Normal)   Pulse (!) 56   Ht 5\' 1"  (1.549 m)   Wt 153 lb (69.4 kg)   LMP 04/21/2023 (Approximate)   BMI 28.91 kg/m  UPT is negative Skin warm and dry. Lungs: clear to ausculation bilaterally. Cardiovascular: regular rate and rhythm.    Fall risk is low  Upstream - 05/15/23 0948       Pregnancy Intention Screening   Does the patient want to become pregnant in the next year? No    Does the patient's partner want to become pregnant in the next year? No    Would the patient like to discuss contraceptive options today? No      Contraception Wrap Up   Current Method Abstinence    End Method Abstinence;Hormonal Injection    Contraception Counseling Provided Yes    How was the end contraceptive method provided? Prescription              Assessment:     1. Pregnancy examination or test, negative result  2. Encounter for initial prescription of injectable contraceptive Will give first injection in office today Will rx depo Meds ordered this encounter  Medications   medroxyPROGESTERone (DEPO-PROVERA) 150 MG/ML injection    Sig: Inject 1 mL (150 mg total) into the muscle every 3 (three) months.    Dispense:  1 mL    Refill:  4    Order Specific Question:   Supervising Provider    Answer:   Lazaro Arms [[id number]]   Use condoms for at least 2 weeks      Plan:     Return in 12 weeks for depo

## 2023-05-15 NOTE — Addendum Note (Signed)
Addended by: Colen Darling on: 05/15/2023 10:21 AM   Modules accepted: Orders

## 2023-06-21 ENCOUNTER — Ambulatory Visit: Payer: 59 | Admitting: Adult Health

## 2023-06-25 ENCOUNTER — Ambulatory Visit (INDEPENDENT_AMBULATORY_CARE_PROVIDER_SITE_OTHER): Payer: 59 | Admitting: Adult Health

## 2023-06-25 ENCOUNTER — Encounter: Payer: Self-pay | Admitting: Adult Health

## 2023-06-25 VITALS — BP 121/66 | HR 51 | Ht 61.0 in | Wt 149.0 lb

## 2023-06-25 DIAGNOSIS — F32A Depression, unspecified: Secondary | ICD-10-CM

## 2023-06-25 DIAGNOSIS — Z3042 Encounter for surveillance of injectable contraceptive: Secondary | ICD-10-CM | POA: Diagnosis not present

## 2023-06-25 DIAGNOSIS — Z01419 Encounter for gynecological examination (general) (routine) without abnormal findings: Secondary | ICD-10-CM

## 2023-06-25 DIAGNOSIS — F419 Anxiety disorder, unspecified: Secondary | ICD-10-CM | POA: Diagnosis not present

## 2023-06-25 MED ORDER — NYSTATIN 100000 UNIT/GM EX CREA: TOPICAL_CREAM | CUTANEOUS | 2 refills | Status: DC

## 2023-06-25 MED ORDER — ESCITALOPRAM OXALATE 10 MG PO TABS: 10.0000 mg | ORAL_TABLET | Freq: Every day | ORAL | 12 refills | Status: DC

## 2023-06-25 NOTE — Progress Notes (Signed)
Patient ID: Sherri Wiggins, female   DOB: May 14, 1996, 27 y.o.   MRN: [id number] History of Present Illness: Sherri Wiggins is a 27 year old biracial female, single, G1P0010, in for a well woman gyn exam. Happy with depo.   PCP is Dr Margo Aye.  Current Medications, Allergies, Past Medical History, Past Surgical History, Family History and Social History were reviewed in [employer] record.     Review of Systems: Patient denies any headaches, hearing loss, fatigue, blurred vision, shortness of breath, chest pain, abdominal pain, problems with bowel movements, urination, or intercourse(not currently active). No joint pain or mood swings.     Physical Exam:BP 121/66 (BP Location: Right Arm, Patient Position: Sitting, Cuff Size: Normal)   Pulse (!) 51   Ht 5\' 1"  (1.549 m)   Wt 149 lb (67.6 kg)   BMI 28.15 kg/m   General:  Well developed, well nourished, no acute distress Skin:  Warm and dry Neck:  Midline trachea, surgically absent  thyroid, good ROM, no lymphadenopathy Lungs; Clear to auscultation bilaterally Breast:  No dominant palpable mass, retraction, or nipple discharge Cardiovascular: Regular rate and rhythm Abdomen:  Soft, non tender, no hepatosplenomegaly Pelvic:  External genitalia is normal in appearance, no lesions.  The vagina is normal in appearance. Urethra has no lesions or masses. The cervix is smooth.  Uterus is felt to be normal size, shape, and contour.  No adnexal masses or tenderness noted.Bladder is non tender, no masses felt. Extremities/musculoskeletal:  No swelling or varicosities noted, no clubbing or cyanosis Psych:  No mood changes, alert and cooperative,seems happy AA is 1 Fall risk is low    06/25/2023    3:40 PM 06/15/2022    2:35 PM 06/02/2021    2:44 PM  Depression screen PHQ 2/9  Decreased Interest 0 0 0  Down, Depressed, Hopeless 1 1 1   PHQ - 2 Score 1 1 1   Altered sleeping 1 0 1  Tired, decreased energy 2 1 0  Change in appetite 2  1 0  Feeling bad or failure about yourself  0 0 0  Trouble concentrating 1 1 1   Moving slowly or fidgety/restless 0 0 1  Suicidal thoughts 0 0 0  PHQ-9 Score 7 4 4        06/25/2023    3:41 PM 06/15/2022    2:36 PM 06/02/2021    2:46 PM 06/01/2020    9:47 AM  GAD 7 : Generalized Anxiety Score  Nervous, Anxious, on Edge 1 0 0 1  Control/stop worrying 2 1 1 2   Worry too much - different things 0 2 1 3   Trouble relaxing 1 0 0 1  Restless 0 1 0 0  Easily annoyed or irritable 1 0 1 0  Afraid - awful might happen 2 1 1 1   Total GAD 7 Score 7 5 4 8   Anxiety Difficulty    Not difficult at all      Upstream - 06/25/23 1547       Pregnancy Intention Screening   Does the patient want to become pregnant in the next year? No    Does the patient's partner want to become pregnant in the next year? No    Would the patient like to discuss contraceptive options today? No      Contraception Wrap Up   Current Method Hormonal Injection    End Method Hormonal Injection    Contraception Counseling Provided Yes  Examination chaperoned by Malachy Mood LPN    Impression and Plan: 1. Encounter for well woman exam with routine gynecological exam Physical in 1 year Labs with PCP Pap in 2026 Requests refill on nystatin cream   2. Anxiety and depression She says is good with lexapro 10 mg will refill  Meds ordered this encounter  Medications   escitalopram (LEXAPRO) 10 MG tablet    Sig: Take 1 tablet (10 mg total) by mouth daily.    Dispense:  30 tablet    Refill:  12    Order Specific Question:   Supervising Provider    Answer:   Duane Lope H [[id number]]   nystatin cream (MYCOSTATIN)    Sig: Apply bid to external affected skin as needed    Dispense:  30 g    Refill:  2    Order Specific Question:   Supervising Provider    Answer:   Despina Hidden, LUTHER H [[id number]]     3. Encounter for surveillance of injectable contraceptive Has refills on depo Next depo 08/07/23

## 2023-07-19 ENCOUNTER — Other Ambulatory Visit (HOSPITAL_COMMUNITY)
Admission: RE | Admit: 2023-07-19 | Discharge: 2023-07-19 | Disposition: A | Discharge status: Home / Self Care | Payer: MEDICAID | Source: Ambulatory Visit | Attending: "Endocrinology | Admitting: "Endocrinology

## 2023-07-19 DIAGNOSIS — E890 Postprocedural hypothyroidism: Secondary | ICD-10-CM | POA: Insufficient documentation

## 2023-07-19 LAB — LIPID PANEL
Cholesterol: 156 mg/dL (ref 0–200)
HDL: 47 mg/dL (ref >40)
LDL Cholesterol: 102 mg/dL — ABNORMAL HIGH (ref 0–99)
Total CHOL/HDL Ratio: 3.3 ratio
Triglycerides: 34 mg/dL (ref <150)
VLDL: 7 mg/dL (ref 0–40)

## 2023-07-19 LAB — T4, FREE: Free T4: 1.21 ng/dL — ABNORMAL HIGH (ref 0.61–1.12)

## 2023-07-19 LAB — TSH: TSH: 6.728 u[IU]/mL — ABNORMAL HIGH (ref 0.350–4.500)

## 2023-07-25 ENCOUNTER — Ambulatory Visit (INDEPENDENT_AMBULATORY_CARE_PROVIDER_SITE_OTHER): Payer: MEDICAID | Admitting: "Endocrinology

## 2023-07-25 ENCOUNTER — Encounter: Payer: Self-pay | Admitting: "Endocrinology

## 2023-07-25 VITALS — BP 104/68 | HR 56 | Ht 61.0 in | Wt 150.2 lb

## 2023-07-25 DIAGNOSIS — E890 Postprocedural hypothyroidism: Secondary | ICD-10-CM

## 2023-07-25 DIAGNOSIS — C73 Malignant neoplasm of thyroid gland: Secondary | ICD-10-CM | POA: Diagnosis not present

## 2023-07-25 MED ORDER — LEVOTHYROXINE SODIUM 112 MCG PO TABS: 112.0000 ug | ORAL_TABLET | Freq: Every day | ORAL | 1 refills | Status: DC

## 2023-07-25 NOTE — Progress Notes (Signed)
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 On no meds, no more seizures   Thyroid disease    enlarged   Webbed toes of both feet    Past Surgical History:  Procedure Laterality Date   BIOPSY THYROID     x 2   CATARACT EXTRACTION W/PHACO Bilateral 04/30/2017   Procedure: CATARACT EXTRACTION PHACO AND INTRAOCULAR LENS PLACEMENT (IOC);  Surgeon: Gemma Payor, MD;  Location: [facility];  Service: Ophthalmology;  Laterality: Bilateral;  LEFT EYE CDE: 0.46 RIGHT EYE CDE: 0.00   EYE SURGERY     laser   HERNIA REPAIR Right    RHINOPLASTY FOR CLEFT LIP / PALATE     STRABISMUS SURGERY     THYROIDECTOMY N/A 07/24/2018   Procedure: TOTAL THYROIDECTOMY;  Surgeon: Franky Macho, MD;  Location: [facility];  Service: General;  Laterality: N/A;   TYMPANOSTOMY TUBE PLACEMENT     Social History   Socioeconomic History   Marital status: Single    Spouse name: Not on file   Number of children: Not on file   Years of education: Not on file   Highest education level: Not on file  Occupational History   Not on file  Tobacco Use   Smoking status: Never   Smokeless tobacco: Never  Vaping Use   Vaping status: Never Used  Substance and Sexual Activity   Alcohol use: Yes    Comment: weekends   Drug use: No   Sexual activity: Not Currently    Birth control/protection: Condom, Injection  Other Topics Concern   Not on file  Social History Narrative   Not on file   Social Determinants of Health   Financial Resource Strain: Low Risk  (06/25/2023)   Overall Financial Resource Strain (CARDIA)    Difficulty of  Paying Living Expenses: Not hard at all  Food Insecurity: No Food Insecurity (06/25/2023)   Hunger Vital Sign    Worried About Running Out of Food in the Last Year: Never true    Ran Out of Food in the Last Year: Never true  Transportation Needs: No Transportation Needs (06/25/2023)   PRAPARE - Administrator, Civil Service (Medical): No    Lack of Transportation (Non-Medical): No  Physical Activity: Insufficiently Active (06/25/2023)   Exercise Vital Sign    Days of Exercise per Week: 1 day    Minutes of Exercise per Session: 10 min  Stress: No Stress Concern Present (06/25/2023)   [employer] of Occupational Health - Occupational Stress Questionnaire    Feeling of Stress : Not at all  Social Connections: Socially Isolated (06/25/2023)   Social Connection and Isolation Panel [NHANES]    Frequency of Communication with Friends and Family: More than three times a  week    Frequency of Social Gatherings with Friends and Family: Once a week    Attends Religious Services: Never    Database administrator or Organizations: No    Attends Banker Meetings: Never    Marital Status: Never married   Outpatient Encounter Medications as of 07/25/2023  Medication Sig   acetaminophen (TYLENOL) 325 MG tablet Take 650 mg by mouth daily as needed for moderate pain or headache.   escitalopram (LEXAPRO) 10 MG tablet Take 1 tablet (10 mg total) by mouth daily.   levothyroxine (SYNTHROID) 112 MCG tablet Take 1 tablet (112 mcg total) by mouth daily before breakfast.   medroxyPROGESTERone (DEPO-PROVERA) 150 MG/ML injection Inject 1 mL (150 mg total) into the muscle every 3 (three) months.   nystatin cream (MYCOSTATIN) Apply bid to external affected skin as needed   [DISCONTINUED] levothyroxine (SYNTHROID) 100 MCG tablet Take 1 tablet (100 mcg total) by mouth daily before breakfast.   No facility-administered encounter medications on file as of 07/25/2023.   ALLERGIES: No Known  Allergies  VACCINATION STATUS: Immunization History  Administered Date(s) Administered   Influenza,inj,Quad PF,6+ Mos 07/25/2018    HPI Sherri Wiggins is 27 y.o. female who presents today with a medical history as above. she is  post near total thyroidectomy following abnormal FNA and Afirma molecular studies of asprate.  She is currently on levothyroxine 100 mcg p.o. daily before breakfast.  She presents with discordant thyroid function tests, admittedly as a result of her skipping her doses sometimes she forgets.     She has no new complaints.  She denies any dysphagia, shortness of breath, nor voice change.    She has suboptimal intelligence,  a poor historian, accompanied by her grandfather.  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  -After her surgery, her immediate post I-131 therapy whole body scan shows mild activity in the neck consistent with thyroid remnant, absent evidence of distant metastasis. - Her recent  thyroid/neck ultrasound is negative for any residual thyroid tissue or recurrence.  This was done in August 2023. -In July 2022  Thyrogen stimulated whole-body scan did not show any tumor recurrence.   She is status post corrective surgery for cleft lip and palate in 1999.    -Her grandmother reveals that 1 of the patient great aunt was diagnosed with thyroid cancer. -Thyroid function tests are consistent  with slight over replacement.   Review of Systems   Objective:    BP 104/68   Pulse (!) 56   Ht 5\' 1"  (1.549 m)   Wt 150 lb 3.2 oz (68.1 kg)   BMI 28.38 kg/m   Wt Readings from Last 3 Encounters:  07/25/23 150 lb 3.2 oz (68.1 kg)  06/25/23 149 lb (67.6 kg)  05/15/23 153 lb (69.4 kg)    Physical Exam    CMP ( most recent) CMP     Component Value Date/Time   NA 140 09/20/2018 0854   K 3.9 09/20/2018 0854   CL 106 09/20/2018 0854    CO2 23 09/20/2018 0854   GLUCOSE 83 09/20/2018 0854   BUN 10 09/20/2018 0854   CREATININE 0.88 09/20/2018 0854   CALCIUM 9.3 09/20/2018 0854   PROT 8.2 (H) 09/20/2018 0854   ALBUMIN 4.3 09/20/2018 0854   AST 16 09/20/2018 0854   ALT 8 09/20/2018 0854   ALKPHOS  39 09/20/2018 0854   BILITOT 2.1 (H) 09/20/2018 0854   GFRNONAA >60 09/20/2018 0854   GFRAA >60 09/20/2018 0854   Recent Results (from the past 2160 hour(s))  POCT urine pregnancy     Status: None   Collection Time: 05/15/23  9:45 AM  Result Value Ref Range   Preg Test, Ur Negative Negative  TSH     Status: Abnormal   Collection Time: 07/19/23  9:16 AM  Result Value Ref Range   TSH 6.728 (H) 0.350 - 4.500 uIU/mL    Comment: Performed by a 3rd Generation assay with a functional sensitivity of <=0.01 uIU/mL. Performed at [facility], [address], [place], [place] [address]   T4, free     Status: Abnormal   Collection Time: 07/19/23  9:16 AM  Result Value Ref Range   Free T4 1.21 (H) 0.61 - 1.12 ng/dL    Comment: (NOTE) Biotin ingestion may interfere with free T4 tests. If the results are inconsistent with the TSH level, previous test results, or the clinical presentation, then consider biotin interference. If needed, order repeat testing after stopping biotin. Performed at [facility], [address] [place], [place] [address]   Lipid panel     Status: Abnormal   Collection Time: 07/19/23  9:16 AM  Result Value Ref Range   Cholesterol 156 0 - 200 mg/dL   Triglycerides 34 <188 mg/dL   HDL 47 >41 mg/dL   Total CHOL/HDL Ratio 3.3 RATIO   VLDL 7 0 - 40 mg/dL   LDL Cholesterol 660 (H) 0 - 99 mg/dL    Comment:        Total Cholesterol/HDL:CHD Risk Coronary Heart Disease Risk Table                     Men   Women  1/2 Average Risk   3.4   3.3  Average Risk       5.0   4.4  2 X Average Risk   9.6   7.1  3 X Average Risk  23.4   11.0        Use the calculated Patient Ratio above and the CHD Risk  Table to determine the patient's CHD Risk.        ATP III CLASSIFICATION (LDL):  <100     mg/dL   Optimal  630-160  mg/dL   Near or Above                    Optimal  130-159  mg/dL   Borderline  109-323  mg/dL   High  >557     mg/dL   Very High Performed at [facility], [address] [place], [place] [address]    Thyroid/neck ultrasound from April 10, 2019: No regional cervical lymphadenopathy.  Post total thyroidectomy without evidence of residual or locally recurrent disease.   Assessment & Plan:   1.  Follicular variant papillary thyroid cancer- pT3a, pNx  -Surgical findings, completion of treatment and long-term surveillance have been discussed with both  the patient and her grandma. -She is status post near total thyroidectomy,  as well as I-131 thyroid remnant ablation with whole-body scan-completed November 2019.  Her June 2020  neck ultrasound did not show any lymphadenopathy nor residual thyroid or recurrent disease. -In July 2022,  Thyrogen stimulated WBS is negative for tumor recurrence. Her most recent thyroid/neck ultrasound in August 2023 showed unremarkable findings.  -She will be considered for  repeat/surveillance neck/thyroid ultrasound next year.   2.  Postsurgical hypothyroidism -Her previsit thyroid function tests are discordant and likely due to inconsistency in taking her thyroid hormone as recommended.  She may benefit from slight increase in levothyroxine 112 mcg p.o. daily before breakfast.     - We discussed about the correct intake of her thyroid hormone, on empty stomach at fasting, with water, separated by at least 30 minutes from breakfast and other medications,  and separated by more than 4 hours from calcium, iron, multivitamins, acid reflux medications (PPIs). -Patient is made aware of the fact that thyroid hormone replacement is needed for life, dose to be adjusted by periodic monitoring of thyroid function tests.   She is advised to maintain  close follow-up with her PMD Dr. Margo Aye.   I spent  21  minutes in the care of the patient today including review of labs from Thyroid Function, CMP, and other relevant labs ; imaging/biopsy records (current and previous including abstractions from other facilities); face-to-face time discussing  her lab results and symptoms, medications doses, her options of short and long term treatment based on the latest standards of care / guidelines;   and documenting the encounter.  Lorenz Coaster  participated in the discussions, expressed understanding, and voiced agreement with the above plans.  All questions were answered to her satisfaction. she is encouraged to contact clinic should she have any questions or concerns prior to her return visit.    Follow up plan: Return in about 6 months (around 01/22/2024) for F/U with Pre-visit Labs.   Marquis Lunch, MD [facility] [address] [place], [place] [address] Phone: [phone]  Fax: [phone]     07/25/2023, 12:12 PM  This note was partially dictated with voice recognition software. Similar sounding words can be transcribed inadequately or may not  be corrected upon review.

## 2023-08-07 ENCOUNTER — Ambulatory Visit (INDEPENDENT_AMBULATORY_CARE_PROVIDER_SITE_OTHER): Payer: MEDICAID | Admitting: *Deleted

## 2023-08-07 DIAGNOSIS — Z3042 Encounter for surveillance of injectable contraceptive: Secondary | ICD-10-CM | POA: Diagnosis not present

## 2023-08-07 MED ORDER — MEDROXYPROGESTERONE ACETATE 150 MG/ML IM SUSP
150.0000 mg | Freq: Once | INTRAMUSCULAR | Status: AC
  Administered 2023-08-07: 150 mg via INTRAMUSCULAR

## 2023-08-07 NOTE — Progress Notes (Signed)
   NURSE VISIT- INJECTION  SUBJECTIVE:  Sherri Wiggins is a 27 y.o. G1P0010 female here for a Depo Provera for contraception/period management. She is a GYN patient.   OBJECTIVE:  There were no vitals taken for this visit.  Appears well, in no apparent distress  Injection administered in: Right deltoid  Meds ordered this encounter  Medications   medroxyPROGESTERone (DEPO-PROVERA) injection 150 mg    ASSESSMENT: GYN patient Depo Provera for contraception/period management PLAN: Follow-up: in 11-13 weeks for next Depo   Annamarie Dawley  08/07/2023 10:01 AM

## 2023-09-28 ENCOUNTER — Other Ambulatory Visit (INDEPENDENT_AMBULATORY_CARE_PROVIDER_SITE_OTHER): Payer: MEDICAID | Admitting: *Deleted

## 2023-09-28 ENCOUNTER — Other Ambulatory Visit (HOSPITAL_COMMUNITY)
Admission: RE | Admit: 2023-09-28 | Discharge: 2023-09-28 | Disposition: A | Discharge status: Home / Self Care | Payer: MEDICAID | Source: Ambulatory Visit | Attending: Obstetrics & Gynecology | Admitting: Obstetrics & Gynecology

## 2023-09-28 DIAGNOSIS — R300 Dysuria: Secondary | ICD-10-CM | POA: Diagnosis not present

## 2023-09-28 DIAGNOSIS — N898 Other specified noninflammatory disorders of vagina: Secondary | ICD-10-CM

## 2023-09-28 LAB — POCT URINALYSIS DIPSTICK
Blood, UA: NEGATIVE
Glucose, UA: NEGATIVE
Ketones, UA: NEGATIVE
Nitrite, UA: NEGATIVE
Protein, UA: NEGATIVE

## 2023-09-28 NOTE — Progress Notes (Signed)
   NURSE VISIT- VAGINITIS/STD  SUBJECTIVE:  Sherri Wiggins is a 27 y.o. G1P0010 GYN patientfemale here for a vaginal swab for vaginitis screening, STD screen.  She reports the following symptoms:  vaginal discharge, irritation & burning with urination  for 2 weeks. Denies abnormal vaginal bleeding, significant pelvic pain, fever.   OBJECTIVE:  There were no vitals taken for this visit.  Appears well, in no apparent distress  ASSESSMENT: Vaginal swab for vaginitis screening, STD Screening & urine culture.  PLAN: Self-collected vaginal probe for Gonorrhea, Chlamydia, Trichomonas, Bacterial Vaginosis, Yeast sent to lab. Urine culture sent to lab. Treatment: to be determined once results are received Follow-up as needed if symptoms persist/worsen, or new symptoms develop  Malachy Mood  09/28/2023 11:38 AM

## 2023-09-30 LAB — URINE CULTURE

## 2023-10-01 LAB — CERVICOVAGINAL ANCILLARY ONLY
Bacterial Vaginitis (gardnerella): NEGATIVE
Candida Glabrata: NEGATIVE
Candida Vaginitis: NEGATIVE
Chlamydia: NEGATIVE
Comment: NEGATIVE
Comment: NEGATIVE
Comment: NEGATIVE
Comment: NEGATIVE
Comment: NEGATIVE
Comment: NORMAL
Neisseria Gonorrhea: NEGATIVE
Trichomonas: NEGATIVE

## 2023-10-18 ENCOUNTER — Other Ambulatory Visit: Payer: Self-pay | Admitting: "Endocrinology

## 2023-10-29 ENCOUNTER — Ambulatory Visit: Payer: MEDICAID

## 2023-12-18 ENCOUNTER — Telehealth: Payer: Self-pay | Admitting: "Endocrinology

## 2023-12-18 NOTE — Telephone Encounter (Signed)
Pt states that she thinks there was supposed to be an ultrasound ordered.  I don't see one in the chart ordered.

## 2023-12-19 NOTE — Telephone Encounter (Signed)
Pts Grandmother was called and informed

## 2024-01-08 ENCOUNTER — Ambulatory Visit (INDEPENDENT_AMBULATORY_CARE_PROVIDER_SITE_OTHER): Payer: MEDICAID | Admitting: Adult Health

## 2024-01-08 ENCOUNTER — Other Ambulatory Visit (HOSPITAL_COMMUNITY)
Admission: RE | Admit: 2024-01-08 | Discharge: 2024-01-08 | Disposition: A | Discharge status: Home / Self Care | Payer: MEDICAID | Source: Ambulatory Visit | Attending: Adult Health | Admitting: Adult Health

## 2024-01-08 ENCOUNTER — Encounter: Payer: Self-pay | Admitting: Adult Health

## 2024-01-08 VITALS — BP 122/76 | HR 58 | Ht 61.0 in | Wt 153.0 lb

## 2024-01-08 DIAGNOSIS — N644 Mastodynia: Secondary | ICD-10-CM

## 2024-01-08 DIAGNOSIS — R102 Pelvic and perineal pain: Secondary | ICD-10-CM | POA: Diagnosis not present

## 2024-01-08 DIAGNOSIS — N926 Irregular menstruation, unspecified: Secondary | ICD-10-CM

## 2024-01-08 DIAGNOSIS — Z3202 Encounter for pregnancy test, result negative: Secondary | ICD-10-CM | POA: Diagnosis not present

## 2024-01-08 DIAGNOSIS — Z30011 Encounter for initial prescription of contraceptive pills: Secondary | ICD-10-CM | POA: Insufficient documentation

## 2024-01-08 DIAGNOSIS — N898 Other specified noninflammatory disorders of vagina: Secondary | ICD-10-CM | POA: Insufficient documentation

## 2024-01-08 LAB — POCT URINE PREGNANCY: Preg Test, Ur: NEGATIVE

## 2024-01-08 MED ORDER — NORETHIN ACE-ETH ESTRAD-FE 1-20 MG-MCG PO TABS: 1.0000 | ORAL_TABLET | Freq: Every day | ORAL | 11 refills | Status: DC

## 2024-01-08 NOTE — Progress Notes (Signed)
  Subjective:     Patient ID: Sherri Wiggins, female   DOB: 1996-09-28, 28 y.o.   MRN: [id number]  HPI Sherri Wiggins is a 28 year old female,single, G1P0010, in complaining of cramping, breast tenderness and vaginal discharge, periods not regular. Had last depo 08/07/23.     Component Value Date/Time   DIAGPAP  06/15/2022 1432    - Negative for Intraepithelial Lesions or Malignancy (NILM)   DIAGPAP - Benign reactive/reparative changes 06/15/2022 1432   DIAGPAP  04/21/2019 0000    NEGATIVE FOR INTRAEPITHELIAL LESIONS OR MALIGNANCY.   HPVHIGH Negative 06/15/2022 1432   ADEQPAP  06/15/2022 1432    Satisfactory for evaluation; transformation zone component PRESENT.   ADEQPAP  04/21/2019 0000    Satisfactory for evaluation  endocervical/transformation zone component PRESENT.   ADEQPAP (A) 04/02/2018 0000    Satisfactory for evaluation  endocervical/transformation zone component ABSENT.    PCP is Dr Margo Aye.  Review of Systems  +cramping, + breast tenderness + vaginal discharge,  +periods not regular. Had last depo 08/07/23.   Not having sex Denies MI,stroke,DVT,breast cancer or migraine with aura Reviewed past medical,surgical, social and family history. Reviewed medications and allergies.  Objective:   Physical Exam BP 122/76 (BP Location: Left Arm, Patient Position: Sitting, Cuff Size: Normal)   Pulse (!) 58   Ht 5\' 1"  (1.549 m)   Wt 153 lb (69.4 kg)   LMP 01/08/2024 (Exact Date)   BMI 28.91 kg/m  UPT is negative    Skin warm and dry.Pelvic: external genitalia is normal in appearance no lesions, vagina: +period blood,urethra has no lesions or masses noted, cervix:smooth, uterus: normal size, shape and contour, non tender, no masses felt, adnexa: no masses or tenderness noted. Bladder is non tender and no masses felt.CV swab obtained. Showed how to place tampon.  [place] - 01/08/24 1523       Pregnancy Intention Screening   Does the patient want to become pregnant in the next year?  No    Does the patient's partner want to become pregnant in the next year? No    Would the patient like to discuss contraceptive options today? Yes      Contraception Wrap Up   Current Method Abstinence    End Method Abstinence;Oral Contraceptive    Contraception Counseling Provided Yes    How was the end contraceptive method provided? Prescription            Examination chaperoned by Malachy Mood LPN   Assessment:     1. Pregnancy examination or test, negative result  - POCT urine pregnancy  2. Pelvic cramping +cramps   3. Breast tenderness in female  4. Irregular periods (Primary) No period lately, last depo in October 2024 Periods started today   5. Encounter for initial prescription of contraceptive pills She wants a pill Will rx junel 1/20,can start today Meds ordered this encounter  Medications   norethindrone-ethinyl estradiol-FE (LOESTRIN FE) 1-20 MG-MCG tablet    Sig: Take 1 tablet by mouth daily.    Dispense:  28 tablet    Refill:  11    Supervising Provider:   Despina Hidden, LUTHER H [[id number]]     6. Vaginal discharge CV swab sent for GC/CHL,trich, BV and yeast  - Cervicovaginal ancillary only( [place])     Plan:     Follow up in 3 months for ROS

## 2024-01-10 ENCOUNTER — Other Ambulatory Visit: Payer: Self-pay | Admitting: Adult Health

## 2024-01-10 LAB — CERVICOVAGINAL ANCILLARY ONLY
Bacterial Vaginitis (gardnerella): POSITIVE — AB
Candida Glabrata: NEGATIVE
Candida Vaginitis: NEGATIVE
Chlamydia: NEGATIVE
Comment: NEGATIVE
Comment: NEGATIVE
Comment: NEGATIVE
Comment: NEGATIVE
Comment: NEGATIVE
Comment: NORMAL
Neisseria Gonorrhea: NEGATIVE
Trichomonas: NEGATIVE

## 2024-01-10 MED ORDER — METRONIDAZOLE 500 MG PO TABS: 500.0000 mg | ORAL_TABLET | Freq: Two times a day (BID) | ORAL | 0 refills | Status: DC

## 2024-01-10 NOTE — Progress Notes (Signed)
+  BV on vaginal swab will rx flagyl, no sex or alcohol while taking meds.  

## 2024-01-15 ENCOUNTER — Other Ambulatory Visit (HOSPITAL_COMMUNITY)
Admission: RE | Admit: 2024-01-15 | Discharge: 2024-01-15 | Disposition: A | Discharge status: Home / Self Care | Payer: MEDICAID | Source: Ambulatory Visit | Attending: "Endocrinology | Admitting: "Endocrinology

## 2024-01-15 DIAGNOSIS — C73 Malignant neoplasm of thyroid gland: Secondary | ICD-10-CM | POA: Diagnosis present

## 2024-01-15 DIAGNOSIS — E890 Postprocedural hypothyroidism: Secondary | ICD-10-CM | POA: Diagnosis not present

## 2024-01-15 LAB — T4, FREE: Free T4: 1.13 ng/dL — ABNORMAL HIGH (ref 0.61–1.12)

## 2024-01-15 LAB — TSH: TSH: 0.097 u[IU]/mL — ABNORMAL LOW (ref 0.350–4.500)

## 2024-01-16 LAB — THYROGLOBULIN ANTIBODY: Thyroglobulin Antibody: <1 [IU]/mL (ref 0.0–0.9)

## 2024-01-22 ENCOUNTER — Ambulatory Visit (INDEPENDENT_AMBULATORY_CARE_PROVIDER_SITE_OTHER): Payer: MEDICAID | Admitting: "Endocrinology

## 2024-01-22 ENCOUNTER — Encounter: Payer: Self-pay | Admitting: "Endocrinology

## 2024-01-22 VITALS — BP 110/64 | HR 72 | Ht 61.0 in | Wt 150.8 lb

## 2024-01-22 DIAGNOSIS — E890 Postprocedural hypothyroidism: Secondary | ICD-10-CM | POA: Diagnosis not present

## 2024-01-22 DIAGNOSIS — C73 Malignant neoplasm of thyroid gland: Secondary | ICD-10-CM | POA: Diagnosis not present

## 2024-01-22 MED ORDER — LEVOTHYROXINE SODIUM 100 MCG PO TABS: 100.0000 ug | ORAL_TABLET | Freq: Every day | ORAL | 1 refills | Status: DC

## 2024-01-22 NOTE — Progress Notes (Signed)
 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 On no meds, no more seizures   Thyroid disease    enlarged   Webbed toes of both feet    Past Surgical History:  Procedure Laterality Date   BIOPSY THYROID     x 2   CATARACT EXTRACTION W/PHACO Bilateral 04/30/2017   Procedure: CATARACT EXTRACTION PHACO AND INTRAOCULAR LENS PLACEMENT (IOC);  Surgeon: Gemma Payor, MD;  Location: [facility];  Service: Ophthalmology;  Laterality: Bilateral;  LEFT EYE CDE: 0.46 RIGHT EYE CDE: 0.00   EYE SURGERY     laser   HERNIA REPAIR Right    RHINOPLASTY FOR CLEFT LIP / PALATE     STRABISMUS SURGERY     THYROIDECTOMY N/A 07/24/2018   Procedure: TOTAL THYROIDECTOMY;  Surgeon: Franky Macho, MD;  Location: [facility];  Service: General;  Laterality: N/A;   TYMPANOSTOMY TUBE PLACEMENT     Social History   Socioeconomic History   Marital status: Single    Spouse name: Not on file   Number of children: Not on file   Years of education: Not on file   Highest education level: Not on file  Occupational History   Not on file  Tobacco Use   Smoking status: Never   Smokeless tobacco: Never  Vaping Use   Vaping status: Never Used  Substance and Sexual Activity   Alcohol use: Yes    Comment: weekends   Drug use: No   Sexual activity: Not Currently    Birth control/protection: Condom  Other Topics Concern   Not on file  Social History Narrative   Not on file   Social Drivers of Health   Financial Resource Strain: Low Risk  (06/25/2023)   Overall Financial Resource Strain (CARDIA)    Difficulty of Paying Living  Expenses: Not hard at all  Food Insecurity: No Food Insecurity (06/25/2023)   Hunger Vital Sign    Worried About Running Out of Food in the Last Year: Never true    Ran Out of Food in the Last Year: Never true  Transportation Needs: No Transportation Needs (06/25/2023)   PRAPARE - Administrator, Civil Service (Medical): No    Lack of Transportation (Non-Medical): No  Physical Activity: Insufficiently Active (06/25/2023)   Exercise Vital Sign    Days of Exercise per Week: 1 day    Minutes of Exercise per Session: 10 min  Stress: No Stress Concern Present (06/25/2023)   [employer] of Occupational Health - Occupational Stress Questionnaire    Feeling of Stress : Not at all  Social Connections: Socially Isolated (06/25/2023)   Social Connection and Isolation Panel [NHANES]    Frequency of Communication with Friends and Family: More than three times a week  Frequency of Social Gatherings with Friends and Family: Once a week    Attends Religious Services: Never    Database administrator or Organizations: No    Attends Banker Meetings: Never    Marital Status: Never married   Outpatient Encounter Medications as of 01/22/2024  Medication Sig   metroNIDAZOLE (FLAGYL) 500 MG tablet Take 1 tablet (500 mg total) by mouth 2 (two) times daily.   acetaminophen (TYLENOL) 325 MG tablet Take 650 mg by mouth daily as needed for moderate pain or headache.   escitalopram (LEXAPRO) 10 MG tablet Take 1 tablet (10 mg total) by mouth daily.   levothyroxine (SYNTHROID) 100 MCG tablet Take 1 tablet (100 mcg total) by mouth daily.   norethindrone-ethinyl estradiol-FE (LOESTRIN FE) 1-20 MG-MCG tablet Take 1 tablet by mouth daily.   nystatin cream (MYCOSTATIN) Apply bid to external affected skin as needed   [DISCONTINUED] levothyroxine (SYNTHROID) 112 MCG tablet Take 1 tablet (112 mcg total) by mouth daily.   No facility-administered encounter medications on file as of 01/22/2024.    ALLERGIES: No Known Allergies  VACCINATION STATUS: Immunization History  Administered Date(s) Administered   Influenza,inj,Quad PF,6+ Mos 07/25/2018    HPI Sherri Wiggins is 28 y.o. female who presents today with a medical history as above. she is  post near total thyroidectomy following abnormal FNA and Afirma molecular studies of asprate.  She is currently on levothyroxine 112 mcg p.o. daily before breakfast.  She presents with her function test consistent with slight over replacement.     She reports better consistency and compliance with her medications this time.   She has no new complaints.  She denies any dysphagia, shortness of breath, nor voice change.    She has suboptimal intelligence,  a poor historian, accompanied by her grandfather.  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  -After her surgery, her immediate post I-131 therapy whole body scan shows mild activity in the neck consistent with thyroid remnant, absent evidence of distant metastasis. - Her recent  thyroid/neck ultrasound is negative for any residual thyroid tissue or recurrence.  This was done in August 2023. -In July 2022  Thyrogen stimulated whole-body scan did not show any tumor recurrence. Her last thyroid/neck ultrasound from August 2023 did not show any thyroid remnant or recurrence.  She is status post corrective surgery for cleft lip and palate in 1999.    -Her grandmother reveals that 1 of the patient great aunt was diagnosed with thyroid cancer.    Review of Systems   Objective:    BP 110/64   Pulse 72   Ht 5\' 1"  (1.549 m)   Wt 150 lb 12.8 oz (68.4 kg)   LMP 01/08/2024 (Exact Date)   BMI 28.49 kg/m   Wt Readings from Last 3 Encounters:  01/22/24 150 lb 12.8 oz (68.4 kg)  01/08/24 153 lb (69.4 kg)  07/25/23 150 lb 3.2 oz (68.1 kg)    Physical Exam    CMP ( most  recent) CMP     Component Value Date/Time   NA 140 09/20/2018 0854   K 3.9 09/20/2018 0854   CL 106 09/20/2018 0854   CO2 23 09/20/2018 0854   GLUCOSE 83 09/20/2018 0854   BUN 10 09/20/2018 0854   CREATININE 0.88 09/20/2018 0854   CALCIUM 9.3 09/20/2018 0854   PROT 8.2 (H)  09/20/2018 0854   ALBUMIN 4.3 09/20/2018 0854   AST 16 09/20/2018 0854   ALT 8 09/20/2018 0854   ALKPHOS 39 09/20/2018 0854   BILITOT 2.1 (H) 09/20/2018 0854   GFRNONAA >60 09/20/2018 0854   GFRAA >60 09/20/2018 0854   Recent Results (from the past 2160 hours)  POCT urine pregnancy     Status: None   Collection Time: 01/08/24  3:18 PM  Result Value Ref Range   Preg Test, Ur Negative Negative  Cervicovaginal ancillary only( [place])     Status: Abnormal   Collection Time: 01/08/24  3:50 PM  Result Value Ref Range   Neisseria Gonorrhea Negative    Chlamydia Negative    Trichomonas Negative    Bacterial Vaginitis (gardnerella) Positive (A)    Candida Vaginitis Negative    Candida Glabrata Negative    Comment      Normal Reference Range Bacterial Vaginosis - Negative   Comment Normal Reference Range Candida Species - Negative    Comment Normal Reference Range Candida Galbrata - Negative    Comment Normal Reference Range Trichomonas - Negative    Comment Normal Reference Ranger Chlamydia - Negative    Comment      Normal Reference Range Neisseria Gonorrhea - Negative  TSH     Status: Abnormal   Collection Time: 01/15/24  1:34 PM  Result Value Ref Range   TSH 0.097 (L) 0.350 - 4.500 uIU/mL    Comment: Performed by a 3rd Generation assay with a functional sensitivity of <=0.01 uIU/mL. Performed at [facility], [address] [place], [place] [address]   T4, free     Status: Abnormal   Collection Time: 01/15/24  1:34 PM  Result Value Ref Range   Free T4 1.13 (H) 0.61 - 1.12 ng/dL    Comment: (NOTE) Biotin ingestion may interfere with free T4 tests. If the results are inconsistent with the TSH  level, previous test results, or the clinical presentation, then consider biotin interference. If needed, order repeat testing after stopping biotin. Performed at [facility], [address] [place], [place] [address]   Thyroglobulin antibody     Status: None   Collection Time: 01/15/24  1:37 PM  Result Value Ref Range   Thyroglobulin Antibody <1.0 0.0 - 0.9 IU/mL    Comment: (NOTE) Thyroglobulin Antibody measured by Beckman Coulter Methodology It should be noted that the presence of thyroglobulin antibodies may not be pathogenic nor diagnostic, especially at very low levels. The assay manufacturer has found that four percent of individuals without evidence of thyroid disease or autoimmunity will have positive TgAb levels up to 4 IU/mL. Performed At: [facility] [address] [place], [place] [id number] Jolene Schimke MD WG:[id number]    Thyroid/neck ultrasound from April 10, 2019: No regional cervical lymphadenopathy.  Post total thyroidectomy without evidence of residual or locally recurrent disease.   Assessment & Plan:   1.  Follicular variant papillary thyroid cancer- pT3a, pNx  -Surgical findings, completion of treatment and long-term surveillance have been discussed with both  the patient and her grandma. -She is status post near total thyroidectomy,  as well as I-131 thyroid remnant ablation with whole-body scan-completed November 2019.  Her June 2020  neck ultrasound did not show any lymphadenopathy nor residual thyroid or recurrent disease. -In July 2022,  Thyrogen stimulated WBS is negative for tumor recurrence. Her most recent thyroid/neck ultrasound in August 2023 showed unremarkable findings.  -She will be considered for repeat/surveillance neck/thyroid ultrasound  next year.   2.  Postsurgical hypothyroidism -Her previsit thyroid function tests are consistent with slight over replacement.  I discussed and lowered her levothyroxine to 100 mcg p.o.  daily before breakfast.    - We discussed about the correct intake of her thyroid hormone, on empty stomach at fasting, with water, separated by at least 30 minutes from breakfast and other medications,  and separated by more than 4 hours from calcium, iron, multivitamins, acid reflux medications (PPIs). -Patient is made aware of the fact that thyroid hormone replacement is needed for life, dose to be adjusted by periodic monitoring of thyroid function tests.   She is advised to maintain close follow-up with her PMD Dr. Margo Aye.    I spent  20  minutes in the care of the patient today including review of labs from Thyroid Function, CMP, and other relevant labs ; imaging/biopsy records (current and previous including abstractions from other facilities); face-to-face time discussing  her lab results and symptoms, medications doses, her options of short and long term treatment based on the latest standards of care / guidelines;   and documenting the encounter.  Sherri Wiggins  participated in the discussions, expressed understanding, and voiced agreement with the above plans.  All questions were answered to her satisfaction. she is encouraged to contact clinic should she have any questions or concerns prior to her return visit.   Follow up plan: Return in about 6 months (around 07/24/2024) for F/U with Pre-visit Labs, Thyroid / Neck Ultrasound.   Marquis Lunch, MD [facility] [address] [place], [place] [address] Phone: [phone]  Fax: [phone]     01/22/2024, 10:54 AM  This note was partially dictated with voice recognition software. Similar sounding words can be transcribed inadequately or may not  be corrected upon review.

## 2024-01-24 LAB — THYROGLOBULIN LEVEL: Thyroglobulin: <2 ng/mL

## 2024-03-06 HISTORY — PX: CARDIAC DEFIBRILLATOR PLACEMENT: SHX171

## 2024-04-09 ENCOUNTER — Ambulatory Visit: Payer: MEDICAID | Admitting: Adult Health

## 2024-04-15 ENCOUNTER — Other Ambulatory Visit: Payer: Self-pay | Admitting: "Endocrinology

## 2024-07-01 ENCOUNTER — Other Ambulatory Visit: Payer: Self-pay | Admitting: Adult Health

## 2024-07-09 ENCOUNTER — Ambulatory Visit (HOSPITAL_COMMUNITY): Admission: RE | Admit: 2024-07-09 | Payer: MEDICAID | Source: Ambulatory Visit

## 2024-07-23 ENCOUNTER — Other Ambulatory Visit: Payer: Self-pay | Admitting: *Deleted

## 2024-07-23 ENCOUNTER — Telehealth: Payer: Self-pay | Admitting: "Endocrinology

## 2024-07-23 DIAGNOSIS — E890 Postprocedural hypothyroidism: Secondary | ICD-10-CM

## 2024-07-23 DIAGNOSIS — C73 Malignant neoplasm of thyroid gland: Secondary | ICD-10-CM

## 2024-07-23 NOTE — Telephone Encounter (Signed)
 Pt needs labs updated

## 2024-07-23 NOTE — Telephone Encounter (Signed)
 Lab orders have been updated.

## 2024-07-24 ENCOUNTER — Ambulatory Visit: Payer: MEDICAID | Admitting: "Endocrinology

## 2024-07-30 ENCOUNTER — Ambulatory Visit (HOSPITAL_COMMUNITY): Admission: RE | Admit: 2024-07-30 | Payer: MEDICAID | Source: Ambulatory Visit

## 2024-08-11 ENCOUNTER — Ambulatory Visit (HOSPITAL_COMMUNITY)
Admission: RE | Admit: 2024-08-11 | Discharge: 2024-08-11 | Disposition: A | Discharge status: Home / Self Care | Payer: MEDICAID | Source: Ambulatory Visit | Attending: "Endocrinology | Admitting: "Endocrinology

## 2024-08-11 DIAGNOSIS — C73 Malignant neoplasm of thyroid gland: Secondary | ICD-10-CM | POA: Insufficient documentation

## 2024-08-14 ENCOUNTER — Other Ambulatory Visit (HOSPITAL_COMMUNITY)
Admission: RE | Admit: 2024-08-14 | Discharge: 2024-08-14 | Disposition: A | Discharge status: Home / Self Care | Payer: MEDICAID | Source: Ambulatory Visit | Attending: "Endocrinology | Admitting: "Endocrinology

## 2024-08-14 DIAGNOSIS — E890 Postprocedural hypothyroidism: Secondary | ICD-10-CM | POA: Diagnosis present

## 2024-08-14 LAB — TSH: TSH: 0.123 u[IU]/mL — ABNORMAL LOW (ref 0.350–4.500)

## 2024-08-14 LAB — T4, FREE: Free T4: 1.45 ng/dL — ABNORMAL HIGH (ref 0.61–1.12)

## 2024-08-16 LAB — THYROGLOBULIN ANTIBODY: Thyroglobulin Antibody: <1 [IU]/mL (ref 0.0–0.9)

## 2024-08-20 ENCOUNTER — Ambulatory Visit (INDEPENDENT_AMBULATORY_CARE_PROVIDER_SITE_OTHER): Payer: MEDICAID | Admitting: "Endocrinology

## 2024-08-20 ENCOUNTER — Encounter: Payer: Self-pay | Admitting: "Endocrinology

## 2024-08-20 VITALS — BP 106/68 | HR 64 | Ht 61.0 in | Wt 153.2 lb

## 2024-08-20 DIAGNOSIS — C73 Malignant neoplasm of thyroid gland: Secondary | ICD-10-CM | POA: Diagnosis not present

## 2024-08-20 DIAGNOSIS — E890 Postprocedural hypothyroidism: Secondary | ICD-10-CM | POA: Diagnosis not present

## 2024-08-20 MED ORDER — LEVOTHYROXINE SODIUM 88 MCG PO TABS: 88.0000 ug | ORAL_TABLET | Freq: Every day | ORAL | 1 refills | Status: DC

## 2024-08-20 NOTE — Progress Notes (Signed)
 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 On no meds, no more seizures   Thyroid  disease    enlarged   Webbed toes of both feet    Past Surgical History:  Procedure Laterality Date   BIOPSY THYROID      x 2   CARDIAC DEFIBRILLATOR PLACEMENT  03/2024   CATARACT EXTRACTION W/PHACO Bilateral 04/30/2017   Procedure: CATARACT EXTRACTION PHACO AND INTRAOCULAR LENS PLACEMENT (IOC);  Surgeon: Perley Hamilton, MD;  Location: [facility];  Service: Ophthalmology;  Laterality: Bilateral;  LEFT EYE CDE: 0.46 RIGHT EYE CDE: 0.00   EYE SURGERY     laser   HERNIA REPAIR Right    RHINOPLASTY FOR CLEFT LIP / PALATE     STRABISMUS SURGERY     THYROIDECTOMY N/A 07/24/2018   Procedure: TOTAL THYROIDECTOMY;  Surgeon: Mavis Anes, MD;  Location: [facility];  Service: General;  Laterality: N/A;   TYMPANOSTOMY TUBE PLACEMENT     Social History   Socioeconomic History   Marital status: Single    Spouse name: Not on file   Number of children: Not on file   Years of education: Not on file   Highest education level: Not on file  Occupational History   Not on file  Tobacco Use   Smoking status: Never   Smokeless tobacco: Never  Vaping Use   Vaping status: Never Used  Substance and Sexual Activity   Alcohol use: Yes    Comment: weekends   Drug use: No   Sexual activity: Not Currently    Birth control/protection: Condom  Other Topics Concern   Not on file  Social History Narrative   Not on file   Social Drivers of Health   Financial Resource Strain: Low Risk  (06/25/2023)   Overall Financial Resource Strain  (CARDIA)    Difficulty of Paying Living Expenses: Not hard at all  Food Insecurity: Low Risk  (04/29/2024)   Received from [facility]   Hunger Vital Sign    Within the past 12 months, you worried that your food would run out before you got money to buy more: Never true    Within the past 12 months, the food you bought just didn't last and you didn't have money to get more. : Never true  Transportation Needs: No Transportation Needs (04/29/2024)   Received from [facility]    In the past 12 months, has lack of reliable transportation kept you from medical appointments, meetings, work or from getting things needed for daily living? : No  Physical Activity: Insufficiently Active (06/25/2023)   Exercise Vital Sign    Days of Exercise per Week: 1 day    Minutes of Exercise per Session: 10 min  Stress: No Stress  Concern Present (06/25/2023)   [employer] of Occupational Health - Occupational Stress Questionnaire    Feeling of Stress : Not at all  Social Connections: Socially Isolated (06/25/2023)   Social Connection and Isolation Panel    Frequency of Communication with Friends and Family: More than three times a week    Frequency of Social Gatherings with Friends and Family: Once a week    Attends Religious Services: Never    Database administrator or Organizations: No    Attends Banker Meetings: Never    Marital Status: Never married   Outpatient Encounter Medications as of 08/20/2024  Medication Sig   metoprolol succinate (TOPROL-XL) 50 MG 24 hr tablet Take 50 mg by mouth daily.   acetaminophen  (TYLENOL ) 325 MG tablet Take 650 mg by mouth daily as needed for moderate pain or headache.   escitalopram  (LEXAPRO ) 10 MG tablet TAKE 1 TABLET(10 MG) BY MOUTH DAILY   levothyroxine  (SYNTHROID ) 88 MCG tablet Take 1 tablet (88 mcg total) by mouth daily.   metroNIDAZOLE  (FLAGYL ) 500 MG tablet Take 1 tablet (500 mg total) by mouth 2 (two) times daily.    norethindrone-ethinyl estradiol -FE (LOESTRIN FE) 1-20 MG-MCG tablet Take 1 tablet by mouth daily.   nystatin  cream (MYCOSTATIN ) Apply bid to external affected skin as needed   [DISCONTINUED] levothyroxine  (SYNTHROID ) 100 MCG tablet Take 1 tablet (100 mcg total) by mouth daily.   No facility-administered encounter medications on file as of 08/20/2024.   ALLERGIES: No Known Allergies  VACCINATION STATUS: Immunization History  Administered Date(s) Administered   Influenza,inj,Quad PF,6+ Mos 07/25/2018    HPI TRISTAN BRAMBLE is 28 y.o. female who presents today with a medical history as above. she is  post near total thyroidectomy following abnormal FNA and Afirma molecular studies . - She is currently on levothyroxine  100 mcg p.o. daily before breakfast.  She presents with her previsit thyroid  function test consistent with slight over replacement.      She has no new complaints, except that she recently passed out a ride on a roller coaster which led to insertion of her defibrillator.  She denies any prior history of cardiac dysfunction, currently following up with [facility] .   She denies any dysphagia, shortness of breath, nor voice change.    She has suboptimal intelligence,  a poor historian, accompanied by her grandparents. -Her previsit neck/ultrasound was negative.  Her background history is as follows -She was found to have about 4 cm thyroid  mass, disrupted during surgery giving her  PT3a, pNx follicular variant papillary thyroid  cancer on the right lobe .  -After her surgery, her immediate post I-131 therapy whole body scan shows mild activity in the neck consistent with thyroid  remnant, absent evidence of distant metastasis. - Her recent  thyroid /neck ultrasound is negative for any residual thyroid  tissue or recurrence.  This was done in August 2023. -In July 2022  Thyrogen  stimulated whole-body scan did not show any tumor recurrence. Her most recent thyroid /neck  ultrasound on August 11, 2024 was negative.     She is status post corrective surgery for cleft lip and palate in 1999.    -Her grandmother reveals that 1 of the patient great aunt was diagnosed with thyroid  cancer.    Review of Systems   Objective:    BP 106/68   Pulse 64   Ht 5' 1 (1.549 m)   Wt 153 lb 3.2 oz (69.5 kg)   BMI 28.95 kg/m   Wt Readings  from Last 3 Encounters:  08/20/24 153 lb 3.2 oz (69.5 kg)  01/22/24 150 lb 12.8 oz (68.4 kg)  01/08/24 153 lb (69.4 kg)    Physical Exam    CMP ( most recent) CMP     Component Value Date/Time   NA 140 09/20/2018 0854   K 3.9 09/20/2018 0854   CL 106 09/20/2018 0854   CO2 23 09/20/2018 0854   GLUCOSE 83 09/20/2018 0854   BUN 10 09/20/2018 0854   CREATININE 0.88 09/20/2018 0854   CALCIUM  9.3 09/20/2018 0854   PROT 8.2 (H) 09/20/2018 0854   ALBUMIN 4.3 09/20/2018 0854   AST 16 09/20/2018 0854   ALT 8 09/20/2018 0854   ALKPHOS 39 09/20/2018 0854   BILITOT 2.1 (H) 09/20/2018 0854   GFRNONAA >60 09/20/2018 0854   GFRAA >60 09/20/2018 0854   Recent Results (from the past 2160 hours)  TSH     Status: Abnormal   Collection Time: 08/14/24 10:20 AM  Result Value Ref Range   TSH 0.123 (L) 0.350 - 4.500 uIU/mL    Comment: Performed at [facility], [address], [place], [place] [address]  Thyroglobulin antibody     Status: None   Collection Time: 08/14/24 10:20 AM  Result Value Ref Range   Thyroglobulin Antibody <1.0 0.0 - 0.9 IU/mL    Comment: (NOTE) Thyroglobulin Antibody measured by [employer]man Coulter Methodology It should be noted that the presence of thyroglobulin antibodies may not be pathogenic nor diagnostic, especially at very low levels. The assay manufacturer has found that four percent of individuals without evidence of thyroid  disease or autoimmunity will have positive TgAb levels up to 4 IU/mL. Performed At: [facility] [address] [place], [place] [id number] Jennette Shorter MD  Ey:[id number]   T4, free     Status: Abnormal   Collection Time: 08/14/24 10:20 AM  Result Value Ref Range   Free T4 1.45 (H) 0.61 - 1.12 ng/dL    Comment: (NOTE) Biotin ingestion may interfere with free T4 tests. If the results are inconsistent with the TSH level, previous test results, or the clinical presentation, then consider biotin interference. If needed, order repeat testing after stopping biotin. Performed at [facility], [address], [place], [place] [address]    Thyroid /neck ultrasound from April 10, 2019: No regional cervical lymphadenopathy.  Post total thyroidectomy without evidence of residual or locally recurrent disease.   Assessment & Plan:   1.  Follicular variant papillary thyroid  cancer- pT3a, pNx  -Surgical findings, completion of treatment and long-term surveillance have been discussed with both  the patient and her grandma. -She is status post near total thyroidectomy,  as well as I-131 thyroid  remnant ablation with whole-body scan-completed November 2019.  Her June 2020  neck ultrasound did not show any lymphadenopathy nor residual thyroid  or recurrent disease. -In July 2022,  Thyrogen  stimulated WBS is negative for tumor recurrence. Her most recent thyroid /neck ultrasound in August 2023 showed unremarkable findings.  - Her previsit neck/thyroid  ultrasound on August 11, 2024 was unremarkable.     2.  Postsurgical hypothyroidism -Her previsit thyroid  function tests are consistent with slight over replacement.  I discussed and lowered her levothyroxine  to 88 mcg daily before breakfast.     - We discussed about the correct intake of her thyroid  hormone, on empty stomach at fasting, with water , separated by at least 30 minutes from breakfast and other medications,  and separated by more than 4 hours from calcium , iron, multivitamins, acid  reflux medications (PPIs). -Patient is made aware of the fact that thyroid  hormone replacement is needed for life,  dose to be adjusted by periodic monitoring of thyroid  function tests.  She is advised to maintain close follow-up with her PMD Dr. Shona.   I spent  20  minutes in the care of the patient today including review of labs from Thyroid  Function, CMP, and other relevant labs ; imaging/biopsy records (current and previous including abstractions from other facilities); face-to-face time discussing  her lab results and symptoms, medications doses, her options of short and long term treatment based on the latest standards of care / guidelines;   and documenting the encounter.  Consepcion GORMAN Level  participated in the discussions, expressed understanding, and voiced agreement with the above plans.  All questions were answered to her satisfaction. she is encouraged to contact clinic should she have any questions or concerns prior to her return visit.   Follow up plan: Return in about 3 months (around 11/20/2024) for F/U with Pre-visit Labs.   Ranny Earl, MD [facility] [address] [place], [place] [address] Phone: [phone]  Fax: [phone]     08/20/2024, 4:14 PM  This note was partially dictated with voice recognition software. Similar sounding words can be transcribed inadequately or may not  be corrected upon review.

## 2024-08-24 LAB — THYROGLOBULIN LEVEL: Thyroglobulin: <2 ng/mL

## 2024-09-08 ENCOUNTER — Other Ambulatory Visit: Payer: Self-pay | Admitting: Adult Health

## 2024-10-06 ENCOUNTER — Other Ambulatory Visit (HOSPITAL_COMMUNITY)
Admission: RE | Admit: 2024-10-06 | Discharge: 2024-10-06 | Disposition: A | Discharge status: Home / Self Care | Payer: MEDICAID | Source: Ambulatory Visit | Attending: Adult Health | Admitting: Adult Health

## 2024-10-06 ENCOUNTER — Encounter: Payer: Self-pay | Admitting: Adult Health

## 2024-10-06 ENCOUNTER — Ambulatory Visit: Payer: MEDICAID | Admitting: Adult Health

## 2024-10-06 VITALS — BP 111/71 | HR 64 | Ht 61.0 in | Wt 155.5 lb

## 2024-10-06 DIAGNOSIS — Z113 Encounter for screening for infections with a predominantly sexual mode of transmission: Secondary | ICD-10-CM | POA: Diagnosis present

## 2024-10-06 DIAGNOSIS — B369 Superficial mycosis, unspecified: Secondary | ICD-10-CM | POA: Insufficient documentation

## 2024-10-06 DIAGNOSIS — Z30013 Encounter for initial prescription of injectable contraceptive: Secondary | ICD-10-CM

## 2024-10-06 DIAGNOSIS — Z3202 Encounter for pregnancy test, result negative: Secondary | ICD-10-CM | POA: Insufficient documentation

## 2024-10-06 DIAGNOSIS — Z01419 Encounter for gynecological examination (general) (routine) without abnormal findings: Secondary | ICD-10-CM

## 2024-10-06 LAB — POCT URINE PREGNANCY: Preg Test, Ur: NEGATIVE

## 2024-10-06 MED ORDER — MEDROXYPROGESTERONE ACETATE 150 MG/ML IM SUSY
150.0000 mg | PREFILLED_SYRINGE | Freq: Once | INTRAMUSCULAR | Status: AC
  Administered 2024-10-06: 150 mg via INTRAMUSCULAR

## 2024-10-06 MED ORDER — MEDROXYPROGESTERONE ACETATE 150 MG/ML IM SUSP: 150.0000 mg | INTRAMUSCULAR | 4 refills | Status: AC

## 2024-10-06 MED ORDER — NYSTATIN 100000 UNIT/GM EX POWD: 1.0000 | Freq: Two times a day (BID) | CUTANEOUS | 1 refills | Status: AC

## 2024-10-06 NOTE — Addendum Note (Signed)
 Addended by: NEYSA CLARITA RAMAN on: 10/06/2024 04:14 PM   Modules accepted: Orders

## 2024-10-06 NOTE — Progress Notes (Signed)
 Patient ID: Sherri Wiggins, female   DOB: December 09, 1995, 28 y.o.   MRN: [id number] History of Present Illness: Sherri Wiggins is a 28 year old female, single, G1P0010, in for a well woman gyn exam and she wants to get back on depo. She had cardiac defibrillator place in May 2025, after passing out at [employer].     Component Value Date/Time   DIAGPAP  06/15/2022 1432    - Negative for Intraepithelial Lesions or Malignancy (NILM)   DIAGPAP - Benign reactive/reparative changes 06/15/2022 1432   DIAGPAP  04/21/2019 0000    NEGATIVE FOR INTRAEPITHELIAL LESIONS OR MALIGNANCY.   HPVHIGH Negative 06/15/2022 1432   ADEQPAP  06/15/2022 1432    Satisfactory for evaluation; transformation zone component PRESENT.   ADEQPAP  04/21/2019 0000    Satisfactory for evaluation  endocervical/transformation zone component PRESENT.   ADEQPAP (A) 04/02/2018 0000    Satisfactory for evaluation  endocervical/transformation zone component ABSENT.    PCP is Dr Shona   Current Medications, Allergies, Past Medical History, Past Surgical History, Family History and Social History were reviewed in [employer] electronic medical record.     Review of Systems: Patient denies any headaches, hearing loss, fatigue, blurred vision, shortness of breath, chest pain, abdominal pain, problems with bowel movements, urination, or intercourse. No joint pain or mood swings.  Has sex about 1.5 weeks ago.   Physical Exam:BP 111/71 (BP Location: Left Arm, Patient Position: Sitting, Cuff Size: Normal)   Pulse 64   Ht 5' 1 (1.549 m)   Wt 155 lb 8 oz (70.5 kg)   LMP 09/17/2024 (Approximate)   BMI 29.38 kg/m  UPT is negative  General:  Well developed, well nourished, no acute distress Skin:  Warm and dry Neck:  Midline trachea, normal thyroid , good ROM, no lymphadenopathy Lungs; Clear to auscultation bilaterally Breast:  No dominant palpable mass, retraction, or nipple discharge, has indented area left axilla that is slightly red,  had chest tube years ago, looks like yeast  Cardiovascular: Regular rate and rhythm Abdomen:  Soft, non tender, no hepatosplenomegaly Pelvic:  External genitalia is normal in appearance, no lesions.  The vagina is normal in appearance. Urethra has no lesions or masses. The cervix is smooth, CV swab obtained.  Uterus is felt to be normal size, shape, and contour.  No adnexal masses or tenderness noted.Bladder is non tender, no masses felt. Extremities/musculoskeletal:  No swelling or varicosities noted, no clubbing or cyanosis Psych:  No mood changes, alert and cooperative,seems happy AA is 1 Fall risk is moderate    10/06/2024    2:55 PM 06/25/2023    3:40 PM 06/15/2022    2:35 PM  Depression screen PHQ 2/9  Decreased Interest 0 0 0  Down, Depressed, Hopeless 1 1 1   PHQ - 2 Score 1 1 1   Altered sleeping 0 1 0  Tired, decreased energy 1 2 1   Change in appetite 1 2 1   Feeling bad or failure about yourself  0 0 0  Trouble concentrating 0 1 1  Moving slowly or fidgety/restless 0 0 0  Suicidal thoughts 0 0 0  PHQ-9 Score 3 7  4       Data saved with a previous flowsheet row definition       10/06/2024    2:55 PM 06/25/2023    3:41 PM 06/15/2022    2:36 PM 06/02/2021    2:46 PM  GAD 7 : Generalized Anxiety Score  Nervous, Anxious, on Edge 0 1 0 0  Control/stop worrying 0 2 1 1   Worry too much - different things 1 0 2 1  Trouble relaxing 0 1 0 0  Restless 0 0 1 0  Easily annoyed or irritable 1 1 0 1  Afraid - awful might happen 0 2 1 1   Total GAD 7 Score 2 7 5 4     Upstream - 10/06/24 1453       Pregnancy Intention Screening   Does the patient want to become pregnant in the next year? No    Does the patient's partner want to become pregnant in the next year? No    Would the patient like to discuss contraceptive options today? Yes      Contraception Wrap Up   Current Method No Method - Other Reason    End Method Hormonal Injection    Contraception Counseling Provided Yes     How was the end contraceptive method provided? Prescription   first shot in office        Examination chaperoned by Clarita Salt LPN     Impression and plan: 1. Negative pregnancy test  - POCT urine pregnancy  2. Encounter for well woman exam with routine gynecological exam (Primary) Pap and physical in 1 year Labs with PCP  3. Screening examination for STD (sexually transmitted disease) CV swab sent for GC/CHL,trich, BV and yeast  - Cervicovaginal ancillary only( [place])  4. Encounter for initial prescription of injectable contraceptive Gave first injection in office and rx sent  Meds ordered this encounter  Medications   medroxyPROGESTERone  (DEPO-PROVERA ) 150 MG/ML injection    Sig: Inject 1 mL (150 mg total) into the muscle every 3 (three) months.    Dispense:  1 mL    Refill:  4    Supervising Provider:   JAYNE MINDER H [[id number]]   nystatin  (MYCOSTATIN /NYSTOP ) powder    Sig: Apply 1 Application topically 2 (two) times daily.    Dispense:  60 g    Refill:  1    Supervising Provider:   JAYNE MINDER H [[id number]]   Return in 12 weeks for depo   5. Superficial fungus infection of skin Keep clean and dry Will rx nystatin  powder

## 2024-10-08 ENCOUNTER — Ambulatory Visit: Payer: Self-pay | Admitting: Adult Health

## 2024-10-08 LAB — CERVICOVAGINAL ANCILLARY ONLY
Bacterial Vaginitis (gardnerella): NEGATIVE
Candida Glabrata: NEGATIVE
Candida Vaginitis: POSITIVE — AB
Chlamydia: NEGATIVE
Comment: NEGATIVE
Comment: NEGATIVE
Comment: NEGATIVE
Comment: NEGATIVE
Comment: NEGATIVE
Comment: NORMAL
Neisseria Gonorrhea: NEGATIVE
Trichomonas: NEGATIVE

## 2024-10-08 MED ORDER — TERCONAZOLE 0.4 % VA CREA: 1.0000 | TOPICAL_CREAM | Freq: Every day | VAGINAL | 0 refills | Status: AC

## 2024-10-23 ENCOUNTER — Encounter (HOSPITAL_COMMUNITY): Payer: Self-pay | Admitting: Family Medicine

## 2024-10-27 ENCOUNTER — Other Ambulatory Visit (HOSPITAL_COMMUNITY): Payer: Self-pay | Admitting: Family Medicine

## 2024-10-27 DIAGNOSIS — N63 Unspecified lump in unspecified breast: Secondary | ICD-10-CM

## 2024-11-13 ENCOUNTER — Ambulatory Visit (HOSPITAL_COMMUNITY)
Admission: RE | Admit: 2024-11-13 | Discharge: 2024-11-13 | Disposition: A | Discharge status: Home / Self Care | Payer: MEDICAID | Source: Ambulatory Visit | Attending: Family Medicine | Admitting: Family Medicine

## 2024-11-13 DIAGNOSIS — N63 Unspecified lump in unspecified breast: Secondary | ICD-10-CM | POA: Diagnosis present

## 2024-11-27 ENCOUNTER — Ambulatory Visit: Payer: MEDICAID | Admitting: "Endocrinology

## 2024-12-29 ENCOUNTER — Ambulatory Visit: Payer: MEDICAID
